# Patient Record
Sex: Male | Born: 1960
Health system: Southern US, Community
[De-identification: ages and names within clinical notes are randomized; demographics above are authoritative.]

## PROBLEM LIST (undated history)

## (undated) DIAGNOSIS — F1010 Alcohol abuse, uncomplicated: Secondary | ICD-10-CM

## (undated) DIAGNOSIS — K449 Diaphragmatic hernia without obstruction or gangrene: Secondary | ICD-10-CM

## (undated) DIAGNOSIS — Z789 Other specified health status: Secondary | ICD-10-CM

## (undated) DIAGNOSIS — E039 Hypothyroidism, unspecified: Secondary | ICD-10-CM

## (undated) DIAGNOSIS — K219 Gastro-esophageal reflux disease without esophagitis: Secondary | ICD-10-CM

## (undated) DIAGNOSIS — E785 Hyperlipidemia, unspecified: Secondary | ICD-10-CM

## (undated) DIAGNOSIS — N529 Male erectile dysfunction, unspecified: Secondary | ICD-10-CM

## (undated) DIAGNOSIS — I10 Essential (primary) hypertension: Secondary | ICD-10-CM

## (undated) DIAGNOSIS — I2510 Atherosclerotic heart disease of native coronary artery without angina pectoris: Secondary | ICD-10-CM

## (undated) DIAGNOSIS — F1910 Other psychoactive substance abuse, uncomplicated: Secondary | ICD-10-CM

## (undated) HISTORY — DX: Essential (primary) hypertension: I10

## (undated) HISTORY — DX: Alcohol abuse, uncomplicated: F10.10

## (undated) HISTORY — PX: TONSILLECTOMY: SUR1361

## (undated) HISTORY — DX: Hyperlipidemia, unspecified: E78.5

## (undated) HISTORY — PX: OTHER SURGICAL HISTORY: SHX169

## (undated) HISTORY — PX: WISDOM TOOTH EXTRACTION: SHX21

## (undated) HISTORY — DX: Male erectile dysfunction, unspecified: N52.9

## (undated) HISTORY — DX: Other specified health status: Z78.9

## (undated) HISTORY — DX: Diaphragmatic hernia without obstruction or gangrene: K44.9

## (undated) HISTORY — DX: Atherosclerotic heart disease of native coronary artery without angina pectoris: I25.10

## (undated) HISTORY — DX: Gastro-esophageal reflux disease without esophagitis: K21.9

## (undated) HISTORY — DX: Other psychoactive substance abuse, uncomplicated: F19.10

## (undated) HISTORY — PX: JOINT REPLACEMENT: SHX530

## (undated) HISTORY — PX: VASECTOMY: SHX75

## (undated) HISTORY — DX: Hypothyroidism, unspecified: E03.9

---

## 1998-02-08 ENCOUNTER — Emergency Department (HOSPITAL_COMMUNITY): Admission: EM | Admit: 1998-02-08 | Discharge: 1998-02-08 | Payer: Self-pay | Admitting: Emergency Medicine

## 1998-05-24 ENCOUNTER — Emergency Department (HOSPITAL_COMMUNITY): Admission: EM | Admit: 1998-05-24 | Discharge: 1998-05-24 | Payer: Self-pay | Admitting: Emergency Medicine

## 1999-12-26 ENCOUNTER — Encounter: Payer: Self-pay | Admitting: *Deleted

## 1999-12-26 ENCOUNTER — Encounter: Admission: RE | Admit: 1999-12-26 | Discharge: 1999-12-26 | Payer: Self-pay | Admitting: *Deleted

## 2001-03-11 ENCOUNTER — Encounter: Admission: RE | Admit: 2001-03-11 | Discharge: 2001-03-11 | Payer: Self-pay | Admitting: *Deleted

## 2001-03-11 ENCOUNTER — Encounter: Payer: Self-pay | Admitting: *Deleted

## 2002-02-28 ENCOUNTER — Emergency Department (HOSPITAL_COMMUNITY): Admission: EM | Admit: 2002-02-28 | Discharge: 2002-02-28 | Payer: Self-pay | Admitting: Emergency Medicine

## 2004-03-08 ENCOUNTER — Emergency Department (HOSPITAL_COMMUNITY): Admission: EM | Admit: 2004-03-08 | Discharge: 2004-03-08 | Payer: Self-pay | Admitting: Family Medicine

## 2004-04-20 ENCOUNTER — Encounter: Admission: RE | Admit: 2004-04-20 | Discharge: 2004-04-20 | Payer: Self-pay | Admitting: Family Medicine

## 2007-03-05 ENCOUNTER — Ambulatory Visit: Payer: Self-pay | Admitting: Family Medicine

## 2007-06-27 ENCOUNTER — Emergency Department: Payer: Self-pay | Admitting: Emergency Medicine

## 2008-08-28 HISTORY — PX: CARDIAC CATHETERIZATION: SHX172

## 2008-11-02 ENCOUNTER — Emergency Department: Payer: Self-pay | Admitting: Internal Medicine

## 2009-03-05 ENCOUNTER — Ambulatory Visit: Payer: Self-pay | Admitting: Cardiovascular Disease

## 2009-03-05 ENCOUNTER — Inpatient Hospital Stay (HOSPITAL_COMMUNITY): Admission: EM | Admit: 2009-03-05 | Discharge: 2009-03-08 | Payer: Self-pay | Admitting: Emergency Medicine

## 2009-04-17 DIAGNOSIS — I10 Essential (primary) hypertension: Secondary | ICD-10-CM | POA: Insufficient documentation

## 2009-04-17 DIAGNOSIS — E785 Hyperlipidemia, unspecified: Secondary | ICD-10-CM | POA: Insufficient documentation

## 2009-04-17 DIAGNOSIS — I2510 Atherosclerotic heart disease of native coronary artery without angina pectoris: Secondary | ICD-10-CM | POA: Insufficient documentation

## 2009-04-22 ENCOUNTER — Ambulatory Visit: Payer: Self-pay | Admitting: Cardiology

## 2009-05-10 ENCOUNTER — Telehealth: Payer: Self-pay | Admitting: Cardiology

## 2009-05-18 ENCOUNTER — Telehealth: Payer: Self-pay | Admitting: Cardiology

## 2009-06-25 ENCOUNTER — Telehealth (INDEPENDENT_AMBULATORY_CARE_PROVIDER_SITE_OTHER): Payer: Self-pay | Admitting: *Deleted

## 2009-06-29 ENCOUNTER — Encounter: Payer: Self-pay | Admitting: Cardiovascular Disease

## 2009-08-09 ENCOUNTER — Ambulatory Visit: Payer: Self-pay | Admitting: Family Medicine

## 2010-08-30 ENCOUNTER — Telehealth: Payer: Self-pay | Admitting: Cardiology

## 2010-09-19 ENCOUNTER — Encounter: Payer: Self-pay | Admitting: Cardiology

## 2010-09-19 ENCOUNTER — Ambulatory Visit
Admission: RE | Admit: 2010-09-19 | Discharge: 2010-09-19 | Payer: Self-pay | Source: Home / Self Care | Attending: Cardiology | Admitting: Cardiology

## 2010-09-22 ENCOUNTER — Ambulatory Visit: Admit: 2010-09-22 | Payer: Self-pay | Admitting: Family Medicine

## 2010-09-22 DIAGNOSIS — Z0289 Encounter for other administrative examinations: Secondary | ICD-10-CM

## 2010-09-27 NOTE — Letter (Signed)
Summary: Insurance forms  Insurance forms   Imported By: Kassie Mends 07/21/2009 11:26:53  _____________________________________________________________________  External Attachment:    Type:   Image     Comment:   External Document

## 2010-09-27 NOTE — Progress Notes (Signed)
  Walk in Patient Form Recieved " PLease complete form for [facility] Stay will forward to Gaylyn Cheers Mesiemore  June 25, 2009 11:55 AM    Appended Document:  paperwork complete and placed at the front desk for pick up. Synetta Fail called.

## 2010-09-27 NOTE — Assessment & Plan Note (Signed)
Summary: NP6/AMD   Visit Type:  new patient Primary Provider:  NONE  CC:  no cardiac complaints.  History of Present Illness: 50 yo with history of nonobstructive CAD, HTN, hyperlipidemia presents for followup.  Patient was initially seen at [facility] in 7/10 with atypical chest pain.  He had an indeterminant coronary CTA with coronary calcium score 424.  LHC showed mild disease except for 90% ostial stenosis in septal perforator.  Patient was managed medically.  Since going home, he has had no further chest pain.  He has no exercise limitations (no exertional shortness of breath).  His BP is 152/90 but he has actually been out of lisinopril for 2 days.  Since starting the BP med, he has had erectile dysfunction and has felt depressed.  He does not smoke but is a heavy drinker.   Labs (7/10): LDL 220  ECG:  NSR, normal  Current Medications (verified): 1)  Aspirin 81 Mg Tbec (Aspirin) .Marland Kitchen.. 1 Tab By Mouth Daily 2)  Synthroid 50 Mcg Tabs (Levothyroxine Sodium) .Marland Kitchen.. 1 Tab By Mouth Daily 3)  Lisinopril 10 Mg Tabs (Lisinopril) .Marland Kitchen.. 1 Tab By Mouth Daily 4)  Lipitor 20 Mg Tabs (Atorvastatin Calcium) .Marland Kitchen.. 1 Tab By Mouth Daily 5)  Prilosec 40 Mg Cpdr (Omeprazole) .Marland Kitchen.. 1 Tab By Mouth Daily  Past History:  Past Medical History: 1. Hiatal hernia and gastroesophageal reflux disease.  2. Hypertension.  3. Hypothyroidism.  4. ETOH abuse.  5. Hyperlipidemia 6. Hypothyroidism 7. CAD: Coronary CTA (7/10) showed calcium score 424 and was indeterminant for coronary disease.  LHC was done (7/10) showing EF 55%, 30% proximal and mid LAD stenoses, 90% stenosis ostially in a moderate septal perforator, otherwise luminal irregularities.  Patient was managed medically.   Family History: Mother died at 41, he is not sure of what.  Father died at 68 of CAD and PVD.  He has a sister, who has substance and tobacco abuse, but otherwise alive and well.      Social History: He lives in [place] with his  fiance.  He is a Naval architect (moves mobile homes).  He denies tobacco abuse but drinks greater than a 12-pack of beer a day.  He denies drug use and does not exercise.   Review of Systems       All systems reviewed and negative except as per HPI.   Vital Signs:  Patient profile:   50 year old male Height:      70 inches Weight:      237 pounds BMI:     34.13 Pulse rate:   80 / minute Pulse rhythm:   regular BP sitting:   152 / 90  (right arm) Cuff size:   large  Vitals Entered By: Mercer Pod (April 22, 2009 10:33 AM)  Physical Exam  General:  Well developed, well nourished, in no acute distress. Head:  normocephalic and atraumatic Nose:  no deformity, discharge, inflammation, or lesions Mouth:  Teeth, gums and palate normal. Oral mucosa normal. Neck:  Neck supple, no JVD. No masses, thyromegaly or abnormal cervical nodes. Lungs:  Clear bilaterally to auscultation and percussion. Heart:  Non-displaced PMI, chest non-tender; regular rate and rhythm, S1, S2 without murmurs, rubs. +S4. Carotid upstroke normal, no bruit.  Pedals normal pulses. No edema, no varicosities. Abdomen:  Bowel sounds positive; abdomen soft and non-tender without masses, organomegaly, or hernias noted. No hepatosplenomegaly. Msk:  Back normal, normal gait. Muscle strength and tone normal. Extremities:  No clubbing or cyanosis.  Neurologic:  Alert and oriented x 3. Skin:  Intact without lesions or rashes. Psych:  Normal affect.   Impression & Recommendations:  Problem # 1:  CAD, UNSPECIFIED SITE (ICD-414.00) Nonobstructive CAD on cath with high calcium score.  Patient needs aggressive risk factor modification to prevent progression.  His LDL is quite high and his BP is high.  Continue ASA.  Continue BP and lipid control.  No further ischemic symptoms.   Problem # 2:  HYPERLIPIDEMIA-MIXED (ICD-272.4) Patient's LDL was 220 in 7/10.  He is on Lipitor 20 mg daily.  Goal LDL given his CAD would be <  70.  I expect that he will need a higher dose.  Check lipids/LFTs in about a month.   Problem # 3:  HYPERTENSION, BENIGN (ICD-401.1) BP is high.  Patient is off his lisinopril.  He is distressed by erectile dysfunction which he has been having since starting the BP med.  I will change him over to Norvasc 5 mg daily to see if he can tolerate this one better.  BP check in 2 wks, titrate up if needed.    Followup in 3 months to address lipids and BP.   Patient Instructions: 1)  Your physician recommends that you schedule a follow-up appointment in: 3 months 2)  Your physician recommends that you return for a FASTING lipid profile: September 3)  Your physician has recommended you make the following change in your medication: stop lisinopril, start Norvasc 5 mg daily 4)  Your physician has requested that you regularly monitor and record your blood pressure readings at home.  Please use the same machine at the same time of day to check your readings and record them. I will call you in 2 weeks to check on readings. Prescriptions: LIPITOR 20 MG TABS (ATORVASTATIN CALCIUM) 1 tab by mouth daily  #30 x 6   Entered by:   Charlena Cross, RN, BSN   Authorized by:   Marca Ancona, MD   Signed by:   Charlena Cross, RN, BSN on 04/22/2009   Method used:   Electronically to        [id number] (retail)       [address]       [place], [place]  [address]       Ph: [phone] or [phone]       Fax: [phone]   RxID:   [id number] SYNTHROID 50 MCG TABS (LEVOTHYROXINE SODIUM) 1 tab by mouth daily  #30 x 1   Entered by:   Charlena Cross, RN, BSN   Authorized by:   Marca Ancona, MD   Signed by:   Charlena Cross, RN, BSN on 04/22/2009   Method used:   Electronically to        [id number] (retail)       [address]       [place], [place]  [address]       Ph: [phone] or [phone]       Fax: [phone]   RxID:    [id number] AMLODIPINE BESYLATE 5 MG TABS (AMLODIPINE BESYLATE) Take one tablet by mouth daily  #30 x 6   Entered by:   Charlena Cross, RN, BSN   Authorized by:   Marca Ancona, MD   Signed by:   Charlena Cross, RN, BSN on 04/22/2009   Method  used:   [facility]ronically to        [id number] ([facility]       [address]       [place], [place]  [address]       Ph: [phone] or [phone]       Fax: [phone]   RxID:   [id number]

## 2010-09-27 NOTE — Progress Notes (Signed)
  Phone Note Outgoing Call   Summary of Call: called pt to check on BP.  Pt states that BP's have normalized on Norvasc.  However, pt has developed a severe sweating problem that was a rancid odor.  This has only started since starting the new medication.  Would like to see if Dr. Shirlee Latch could prescribe something different. Initial call taken by: Charlena Cross, RN, BSN,  May 10, 2009 10:32 AM     Appended Document:  This medication is one of the least likely to cause erectile dysfunction.  Would try it a week longer and if he still is having a problem can change it.   Appended Document:  spoke with pt's wife regarding medication side effects.  wife will relay information to pt.

## 2010-09-27 NOTE — Progress Notes (Signed)
Summary: LIPITOR  Phone Note Call from Patient Call back at Home Phone [phone]   Caller: SELF Call For: [facility] Summary of Call: CANNOT TAKE LIPITOR ANYMORE-MAKING ALL OF HIS MUSCLES IN HIS LEGS AND ARMS HURT-CANNOT BEND THEM Initial call taken by: Harlon Flor,  May 18, 2009 10:28 AM  Follow-up for Phone Call        message sent to DM. Follow-up by: Charlena Cross, RN, BSN,  May 18, 2009 12:03 PM     Appended Document: LIPITOR Tell him to stop Lipitor now.  His LDL is very high.  Would he be willing to start a lower dose of a statin that is less likely to cause muscle pain after a few weeks off statins altogether? If he is willing to try, would go off statin for 4 weeks then try pravastatin at 40 mg daily with Co Q-10 100 mg daily if muscle pain has resolved.   Appended Document: LIPITOR pt agrees to try medication.  will start @ the end of oct. Charlena Cross RN BSN

## 2010-09-27 NOTE — Progress Notes (Signed)
Summary: PHI  PHI   Imported By: Harlon Flor 04/22/2009 11:47:31  _____________________________________________________________________  External Attachment:    Type:   Image     Comment:   External Document

## 2010-09-29 NOTE — Progress Notes (Signed)
Summary: Needs f/u  Phone Note Call from Patient Call back at Home Phone [phone]   Caller: Patient Call For: nurse Summary of Call: Would like to discuss medications with you.  Initial call taken by: Lysbeth Galas CMA,  August 30, 2010 12:16 PM  Follow-up for Phone Call        Spoke to pt, he is unclear of what medications he is supposed to be taking, he states he only takes amlodipine and asa 81. Reviewed pt's chart, pt has not had ov since 03/2009 and did not show for 3 mo f/u after that. Please schedule f/u with Dr. Shirlee Latch. Follow-up by: Lanny Hurst RN,  August 30, 2010 4:22 PM  Additional Follow-up for Phone Call Additional follow up Details #1::        LMOM TCB to schedule an appt with [facility]. Additional Follow-up by: Harlon Flor,  September 07, 2010 1:15 PM    Additional Follow-up for Phone Call Additional follow up Details #2::    Spoke to pt, scheduled him to see Dr. Shirlee Latch 09/19/10 at 3:45. Follow-up by: Lanny Hurst RN,  September 08, 2010 10:50 AM

## 2010-09-29 NOTE — Assessment & Plan Note (Signed)
Summary: ROV   Visit Type:  Follow-up Primary Provider:  NONE  CC:  "doing well" denies chest pain and SOB.Marland Kitchen  History of Present Illness: 50 yo with history of nonobstructive CAD, HTN, hyperlipidemia presents for followup.  Patient was initially seen at [facility] in 7/10 with atypical chest pain.  He had an indeterminant coronary CTA with coronary calcium score 424.  LHC showed mild disease except for 90% ostial stenosis in septal perforator.  Patient was managed medically.  He has had no further chest pain or exertional dyspnea.  I saw him over a year ago and started him on BP meds and a statin.  He had myalgias with Lipitor so is not taking anything now for his lipids.  He continues to take ASA and amlodipine.  BP is good today.  He has had some conflict lately in his personal life and his girlfriend is planning to move out.   Patient's main complaint today is left shoulder pain.  He fell on his left shoulder recently and now has shoulder pain and is unable to abduct the arm above horizontal.    Labs (7/10): LDL 220  ECG:  NSR, biatrial enlargement  Current Medications (verified): 1)  Aspirin 81 Mg Tbec (Aspirin) .Marland Kitchen.. 1 Tab By Mouth Daily 2)  Synthroid 50 Mcg Tabs (Levothyroxine Sodium) .Marland Kitchen.. 1 Tab By Mouth Daily 3)  Prilosec 40 Mg Cpdr (Omeprazole) .Marland Kitchen.. 1 Tab By Mouth Daily 4)  Amlodipine Besylate 5 Mg Tabs (Amlodipine Besylate) .... Take One Tablet By Mouth Daily 5)  Pravastatin Sodium 40 Mg Tabs (Pravastatin Sodium) .... Take One Tablet By Mouth Daily At Bedtime 6)  Co Q-10 100 Mg Caps (Coenzyme Q10) .Marland Kitchen.. 1 Tab Daily  Allergies (verified): No Known Drug Allergies  Past History:  Past Surgical History: Last updated: 04/17/2009 Tonsillectomy  Family History: Last updated: 04/27/2009 Mother died at 59, he is not sure of what.  Father died at 48 of CAD and PVD.  He has a sister, who has substance and tobacco abuse, but otherwise alive and well.      Social History: Last  updated: 09/19/2010 He lives in [place] with his girlfriend, but she is getting ready to move out.  He is a Naval architect (moves mobile homes).  He denies tobacco abuse but drinks greater than a 12-pack of beer a day.  He denies drug use and does not exercise.   Past Medical History: 1. Hiatal hernia and gastroesophageal reflux disease.  2. Hypertension: erectile dysfunction with ACEI.  3. Hypothyroidism.  4. ETOH abuse.  5. Hyperlipidemia: myalgias with atorvastatin.  6. Hypothyroidism 7. CAD: Coronary CTA (7/10) showed calcium score 424 and was indeterminant for coronary disease.  LHC was done (7/10) showing EF 55%, 30% proximal and mid LAD stenoses, 90% stenosis ostially in a moderate septal perforator, otherwise luminal irregularities.  Patient was managed medically.   Family History: Reviewed history from 27-Apr-2009 and no changes required. Mother died at 37, he is not sure of what.  Father died at 57 of CAD and PVD.  He has a sister, who has substance and tobacco abuse, but otherwise alive and well.      Social History: Reviewed history from 2009/04/27 and no changes required. He lives in [place] with his girlfriend, but she is getting ready to move out.  He is a Naval architect (moves mobile homes).  He denies tobacco abuse but drinks greater than a 12-pack of beer a day.  He denies drug use and does  not exercise.   Review of Systems       All systems reviewed and negative except as per HPI.   Vital Signs:  Patient profile:   50 year old male Height:      70 inches Weight:      241.75 pounds BMI:     34.81 Pulse rate:   91 / minute BP sitting:   130 / 82  (left arm) Cuff size:   large  Vitals Entered By: Lysbeth Galas CMA (September 19, 2010 4:31 PM)  Physical Exam  General:  Well developed, well nourished, in no acute distress. Neck:  Neck supple, no JVD. No masses, thyromegaly or abnormal cervical nodes. Lungs:  Clear bilaterally to auscultation and  percussion. Heart:  Non-displaced PMI, chest non-tender; regular rate and rhythm, S1, S2 without murmurs, rubs. +S4. Carotid upstroke normal, no bruit.  Pedals normal pulses. No edema, no varicosities. Abdomen:  Bowel sounds positive; abdomen soft and non-tender without masses, organomegaly, or hernias noted. No hepatosplenomegaly. Msk:  Unable to abduct left arm above horizontal level.  Extremities:  No clubbing or cyanosis. Neurologic:  Alert and oriented x 3. Psych:  Normal affect.   Impression & Recommendations:  Problem # 1:  HYPERLIPIDEMIA-MIXED (ICD-272.4) Very high LDL when last checked in 2010.  No statin currently given myalgias with Lipitor.  I am going to start pravastatin 40 mg daily with 100 mg Coenzyme Q10.  Hopefully he can tolerate this.  Given known CAD, goal LDL is < 70 ideally.   Problem # 2:  CAD, UNSPECIFIED SITE (ICD-414.00) Nonobstructive CAD on cath with high calcium score.  Patient needs aggressive risk factor modification to prevent progression.  Needs continued control of BP and will start statin.  He takes ASA 81 mg daily.   Problem # 3:  HYPERTENSION, BENIGN (ICD-401.1) Well-controlled today.  Continue amlodipine.   Problem # 4:  HYPOTHYROIDISM-IATROGENIC (ICD-244.3) Patient carries diagnosis of hypothyroidism.  Though it is on his list, he has not been taking Synthroid.  Will check TSH.    Problem # 5:  SHOULDER PAIN Left shoulder pain and difficulty with abduction beyond horizontal since he fell on his shoulder.  He is worried about developing a frozen shoulder.  I am going to refer him to Dr. Patsy Lager for shoulder evaluation and to serve as his PCP.   Other Orders: EKG w/ Interpretation (93000)  Patient Instructions: 1)  Your physician recommends that you schedule a follow-up appointment in: 1 year 2)  TO BE SCHEDULED IN 2 MONTHS:  Your physician recommends that you return for a FASTING lipid profile: (liver, lipid, BMP) 3)  Your physician recommends  that you continue on your current medications as directed. Please refer to the Current Medication list given to you today. CONTINUE Pravastatin 40mg  and Coenzyme Q10 100mg . 4)  You have an appt with Dr. Dallas Schimke at [place] [facility] this Thursday 09/22/10 @ 9:00am for your shoulder, please arrive at 8:45am. Prescriptions: PRAVASTATIN SODIUM 40 MG TABS (PRAVASTATIN SODIUM) Take one tablet by mouth daily at bedtime  #30 x 6   Entered by:   Lanny Hurst RN   Authorized by:   Marca Ancona, MD   Signed by:   Lanny Hurst RN on 09/19/2010   Method used:   [facility] to        [id number]       [address] [facility]  [place], [place]  [address]       Ph: [phone] or [phone]       Fax: [phone]   RxID:   [id number]

## 2010-10-07 ENCOUNTER — Telehealth: Payer: Self-pay | Admitting: Cardiology

## 2010-10-19 NOTE — Progress Notes (Signed)
Summary: Pain on right side  Phone Note Call from Patient Call back at Home Phone [phone]   Caller: Self Call For: [place] Summary of Call: Pt fell several weeks ago.  There was pain on that side and now the pain has moved to the right side.  Has been told that it might be heart related. Initial call taken by: [place],  October 07, 2010 3:03 PM  Follow-up for Phone Call        Spoke to pt, he fell on the job a few weeks ago (prior to his last ov) and injured his left side (arm/shoulder/side). He did not see an MD after the fall. He had been taking ibuprofen/aleve for pain which did not help his pain at first. Now he states his pain has resolved on left side, now he c/o pain in his right shoulder down to elbow "throbbing sensation."  Pt denies chest pain/SOB, any other symptoms. Pt was told this could be cardiac. Advised pt to monitor over the weekend, and to continue to take aleve/ibuprofen/tylenol and find out if any of those releive the pain. If worsens and not getting any better, told him he may need to see PCP if due to musculoskeletal injury. Advised pt if SOB/Chest pain or any additional symptoms begin over the weekend to go to ER, however, this does not sound cardiac related in nature and may be musculoskeletal. Will notify Dr. Shirlee Latch to see if he has any further recommendations, and notified pt to call me on Monday to update me on his status. Follow-up by: Lanny Hurst RN,  October 07, 2010 4:46 PM     Appended Document: Pain on right side Does not sound particularly worrisome for cardiac problem, would followup with him Monday though.   Appended Document: Pain on right side Attempted to call pt LMOM TCB /MES  Appended Document: Pain on right side Spoke to pt, he states now the pain has gone back to the left arm and he states pain is only in arm, no other symptoms. Advised pt to see his PCP about this and that this does not sound cardiac related. Pt is taking Aleve two  times a day with no relief. Pt states he will see PCP.

## 2010-11-18 ENCOUNTER — Other Ambulatory Visit: Payer: Self-pay | Admitting: *Deleted

## 2010-12-04 LAB — CBC
HCT: 44.5 % (ref 39.0–52.0)
HCT: 46 % (ref 39.0–52.0)
Hemoglobin: 15.8 g/dL (ref 13.0–17.0)
Hemoglobin: 16.4 g/dL (ref 13.0–17.0)
MCHC: 35.5 g/dL (ref 30.0–36.0)
MCHC: 35.5 g/dL (ref 30.0–36.0)
MCV: 99.3 fL (ref 78.0–100.0)
MCV: 99.5 fL (ref 78.0–100.0)
Platelets: 193 10*3/uL (ref 150–400)
Platelets: 227 10*3/uL (ref 150–400)
RBC: 4.47 MIL/uL (ref 4.22–5.81)
RBC: 4.64 MIL/uL (ref 4.22–5.81)
RDW: 12.6 % (ref 11.5–15.5)
RDW: 12.6 % (ref 11.5–15.5)
WBC: 3.8 10*3/uL — ABNORMAL LOW (ref 4.0–10.5)
WBC: 4.2 10*3/uL (ref 4.0–10.5)

## 2010-12-04 LAB — BASIC METABOLIC PANEL
BUN: 7 mg/dL (ref 6–23)
BUN: 8 mg/dL (ref 6–23)
CO2: 26 mEq/L (ref 19–32)
CO2: 26 mEq/L (ref 19–32)
Calcium: 8.7 mg/dL (ref 8.4–10.5)
Calcium: 9.1 mg/dL (ref 8.4–10.5)
Chloride: 100 mEq/L (ref 96–112)
Chloride: 102 mEq/L (ref 96–112)
Creatinine, Ser: 0.81 mg/dL (ref 0.4–1.5)
Creatinine, Ser: 0.94 mg/dL (ref 0.4–1.5)
GFR calc Af Amer: >60 mL/min (ref >60)
GFR calc Af Amer: >60 mL/min (ref >60)
GFR calc non Af Amer: >60 mL/min (ref >60)
GFR calc non Af Amer: >60 mL/min (ref >60)
Glucose, Bld: 110 mg/dL — ABNORMAL HIGH (ref 70–99)
Glucose, Bld: 124 mg/dL — ABNORMAL HIGH (ref 70–99)
Potassium: 4.1 mEq/L (ref 3.5–5.1)
Potassium: 4.5 mEq/L (ref 3.5–5.1)
Sodium: 132 mEq/L — ABNORMAL LOW (ref 135–145)
Sodium: 136 mEq/L (ref 135–145)

## 2010-12-04 LAB — GLUCOSE, CAPILLARY: Glucose-Capillary: 112 mg/dL — ABNORMAL HIGH (ref 70–99)

## 2010-12-04 LAB — LIPID PANEL
Cholesterol: 294 mg/dL — ABNORMAL HIGH (ref 0–200)
Cholesterol: 311 mg/dL — ABNORMAL HIGH (ref 0–200)
HDL: 53 mg/dL (ref >39)
HDL: 54 mg/dL (ref >39)
LDL Cholesterol: 218 mg/dL — ABNORMAL HIGH (ref 0–99)
LDL Cholesterol: 220 mg/dL — ABNORMAL HIGH (ref 0–99)
Total CHOL/HDL Ratio: 5.5 RATIO
Total CHOL/HDL Ratio: 5.8 RATIO
Triglycerides: 104 mg/dL (ref <150)
Triglycerides: 195 mg/dL — ABNORMAL HIGH (ref <150)
VLDL: 21 mg/dL (ref 0–40)
VLDL: 39 mg/dL (ref 0–40)

## 2010-12-04 LAB — HEPATIC FUNCTION PANEL
ALT: 47 U/L (ref 0–53)
AST: 46 U/L — ABNORMAL HIGH (ref 0–37)
Albumin: 3.8 g/dL (ref 3.5–5.2)
Alkaline Phosphatase: 48 U/L (ref 39–117)
Bilirubin, Direct: 0.3 mg/dL (ref 0.0–0.3)
Indirect Bilirubin: 1.2 mg/dL — ABNORMAL HIGH (ref 0.3–0.9)
Total Bilirubin: 1.5 mg/dL — ABNORMAL HIGH (ref 0.3–1.2)
Total Protein: 6.9 g/dL (ref 6.0–8.3)

## 2010-12-04 LAB — PROTIME-INR
INR: 0.9 (ref 0.00–1.49)
Prothrombin Time: 12.4 seconds (ref 11.6–15.2)

## 2010-12-04 LAB — DIFFERENTIAL
Basophils Absolute: 0 10*3/uL (ref 0.0–0.1)
Basophils Relative: 1 % (ref 0–1)
Eosinophils Absolute: 0.1 10*3/uL (ref 0.0–0.7)
Eosinophils Relative: 1 % (ref 0–5)
Lymphocytes Relative: 20 % (ref 12–46)
Lymphs Abs: 0.7 10*3/uL (ref 0.7–4.0)
Monocytes Absolute: 0.4 10*3/uL (ref 0.1–1.0)
Monocytes Relative: 10 % (ref 3–12)
Neutro Abs: 2.6 10*3/uL (ref 1.7–7.7)
Neutrophils Relative %: 68 % (ref 43–77)

## 2010-12-04 LAB — POCT CARDIAC MARKERS
CKMB, poc: 2 ng/mL (ref 1.0–8.0)
CKMB, poc: 2.7 ng/mL (ref 1.0–8.0)
CKMB, poc: 3.3 ng/mL (ref 1.0–8.0)
CKMB, poc: 4.2 ng/mL (ref 1.0–8.0)
Myoglobin, poc: 109 ng/mL (ref 12–200)
Myoglobin, poc: 119 ng/mL (ref 12–200)
Myoglobin, poc: 145 ng/mL (ref 12–200)
Myoglobin, poc: 84.2 ng/mL (ref 12–200)
Troponin i, poc: <0.05 ng/mL (ref 0.00–0.09)
Troponin i, poc: <0.05 ng/mL (ref 0.00–0.09)
Troponin i, poc: <0.05 ng/mL (ref 0.00–0.09)
Troponin i, poc: <0.05 ng/mL (ref 0.00–0.09)

## 2010-12-04 LAB — CARDIAC PANEL(CRET KIN+CKTOT+MB+TROPI)
CK, MB: 5.4 ng/mL — ABNORMAL HIGH (ref 0.3–4.0)
CK, MB: 5.7 ng/mL — ABNORMAL HIGH (ref 0.3–4.0)
Relative Index: 2 (ref 0.0–2.5)
Relative Index: 2.4 (ref 0.0–2.5)
Total CK: 229 U/L (ref 7–232)
Total CK: 289 U/L — ABNORMAL HIGH (ref 7–232)
Troponin I: 0.02 ng/mL (ref 0.00–0.06)
Troponin I: 0.02 ng/mL (ref 0.00–0.06)

## 2010-12-04 LAB — APTT: aPTT: 27 seconds (ref 24–37)

## 2010-12-04 LAB — CK TOTAL AND CKMB (NOT AT ARMC)
CK, MB: 6.2 ng/mL — ABNORMAL HIGH (ref 0.3–4.0)
Relative Index: 2.8 — ABNORMAL HIGH (ref 0.0–2.5)
Total CK: 221 U/L (ref 7–232)

## 2010-12-04 LAB — HEMOGLOBIN A1C
Hgb A1c MFr Bld: 5.2 % (ref 4.6–6.1)
Mean Plasma Glucose: 103 mg/dL

## 2010-12-04 LAB — TROPONIN I: Troponin I: 0.03 ng/mL (ref 0.00–0.06)

## 2010-12-04 LAB — TSH: TSH: 7.545 u[IU]/mL — ABNORMAL HIGH (ref 0.350–4.500)

## 2011-01-10 NOTE — Cardiovascular Report (Signed)
NAMEJOCSAN, Samuel Heath NO.:  [id number]   MEDICAL RECORD NO.:  [id number]          PATIENT TYPE:  INP   LOCATION:  2040                         FACILITY:  [facility]   PHYSICIAN:  Marca Ancona, MD      DATE OF BIRTH:  Mar 25, 1961   DATE OF PROCEDURE:  03/08/2009  DATE OF DISCHARGE:  03/08/2009                            CARDIAC CATHETERIZATION   PROCEDURES:  1. Left heart catheterization.  2. Coronary angiography.  3. Left ventriculography.  4. Angio-Seal placement.   INDICATION:  Chest pain in a patient with an indeterminate coronary CT  angiogram.   PROCEDURE NOTE:  After informed consent was obtained, the right groin  was sterilely prepped and draped.  A 1% lidocaine was used to locally  anesthetize the right groin area.  A 6-French arterial sheath was placed  using Seldinger technique into the right common femoral artery.  The  left coronary artery was engaged using a 6-French multipurpose catheter.  The left ventricle was entered using the 6-French multipurpose catheter  and the right coronary artery was entered using the 6-French JR4  catheter.  There were no complications.  An injection was also done to  show the sheath was entered into the left common femoral artery.  An  Angio-Seal was then deployed.   FINDINGS:  1. Hemodynamics:  Aorta 146/95, LV 144/10.  2. Left ventriculography:  EF was estimated at 55%.  There were no      wall motion abnormalities.  3. Right coronary artery:  The right coronary artery was a moderate-      sized vessel.  It was probably codominant.  There was some catheter      spasm proximally, but no significant disease.  The ostium was shown      to be free of significant disease on injections taken approaching      the vessel.  4. Left main:  There is no significant disease in the left main      coronary artery.  5. Left circumflex:  The left circumflex system was large and co-      dominant.  There was a ramus and 4  moderate-sized obtuse marginal      vessels.  6. LAD:  The first and second diagonals were moderate-sized vessels      with no significant disease.  Following these 2 diagonals, there      was a small-to-moderate sized septal perforator with 90% ostial      stenosis.  The mid LAD had about 30% stenosis and the distal LAD      had about 30% stenosis.   ASSESSMENT AND PLAN:  This is a 50 year old who presented with chest  pain and had an indeterminate coronary CT angiogram.  The concerning  area on the coronary computed tomography angiography was in the mid LAD.  The left heart catheterization shows only mild disease in this area.  There is a significantly diseased ostial  septal perforator, however.  We will plan on medical management of the  patient's coronary disease with aggressive treatment of his risk  factors.  He  will be discharged home on Synthroid 50 mcg a day for his  hypothyroidism, lisinopril 10 mg a day for his hypertension, aspirin 81  mg a day and Lipitor 20 mg a day.      Marca Ancona, MD  Electronically Signed     DM/MEDQ  D:  03/08/2009  T:  03/08/2009  Job:  [id number]

## 2011-01-10 NOTE — Discharge Summary (Signed)
NAMEROWLAND, ERICSSON NO.:  [id number]   MEDICAL RECORD NO.:  [id number]          PATIENT TYPE:  INP   LOCATION:  2040                         FACILITY:  [facility]   PHYSICIAN:  Marca Ancona, MD      DATE OF BIRTH:  06/08/1961   DATE OF ADMISSION:  03/05/2009  DATE OF DISCHARGE:  03/08/2009                               DISCHARGE SUMMARY   PROCEDURES:  1. Cardiac catheterization.  2. Coronary arteriogram.  3. Left ventriculogram.   PRIMARY FINAL DISCHARGE DIAGNOSIS:  Chest pain, nonobstructive coronary  artery disease and catheterization.   SECONDARY DIAGNOSES:  1. History of hiatal hernia and gastroesophageal reflux disease.  2. Hypertension.  3. Hypothyroidism.  4. History of ethyl alcohol (consumption, dependency) abuse.  5. Family history of coronary artery disease, not premature.   TIME OF DISCHARGE:  36 minutes.   HOSPITAL COURSE:  Samuel Heath is a 50 year old male with no previous  history of coronary artery disease.  He had chest pain and had been to  [facility] hospital but his symptoms did not improve.  He came to [facility] and was admitted for further evaluation and treatment.   He had CT angiography of the chest which showed greater than 50%  calcification in the proximal LAD.  His calcium score was 424.  His  cardiac enzymes were negative for MI.  Cardiac catheterization was  performed on March 08, 2009 which showed 30% LAD and a 90% septal  perforator.  He had some catheter spasm with the RCA.  His EF was 55%  with no wall motion abnormalities.   Dr. Shirlee Latch evaluated the films and felt that he had probable noncardiac  chest pain.  His blood pressure was treated by adding lisinopril to his  medication regimen.  He also had an elevated TSH of 7.5 and was started  on low-dose Synthroid.  His LDL was elevated at 220 and he was started  on Lipitor.  Bedrest is pending completion but if his groin is stable  with ambulation he is tentatively  considered stable for discharge on  March 08, 2009 with outpatient followup arranged.   DISCHARGE INSTRUCTIONS:  1. His activity level is to be increased gradually with no driving for      2 days and no lifting for a week.  2. He is supposed to call our office for problems with the cath site.  3. He is encouraged to stick to a low-sodium heart-healthy diet.  4. He is to follow up with Dr. Shirlee Latch in [place] on March 17, 2009      at 11 a.m.  5. He is encouraged to obtain a primary care physician.   DISCHARGE MEDICATIONS:  1. Aspirin 81 mg daily.  2. Synthroid 50 mcg daily.  3. Lisinopril 10 mg a day.  4. Lipitor 20 mg a day p.m.  5. Prilosec OTC daily.   Samuel Heath was advised to limit alcohol and is encouraged to drink only  112 ounces beer daily.      Samuel Demark, PA-C      Marca Ancona,  MD  Electronically Signed    RB/MEDQ  D:  03/08/2009  T:  03/08/2009  Job:  [id number]

## 2011-01-10 NOTE — H&P (Signed)
NAMELIOR, HOEN NO.:  [id number]   MEDICAL RECORD NO.:  [id number]          PATIENT TYPE:  INP   LOCATION:  2040                         FACILITY:  [facility]   PHYSICIAN:  Noralyn Pick. Eden Emms, MD, FACCDATE OF BIRTH:  07-09-1961   DATE OF ADMISSION:  03/05/2009  DATE OF DISCHARGE:                              HISTORY & PHYSICAL   PRIMARY CARDIOLOGIST:  [facility].  He should follow up in  [place].  Seen by Dr Eden Emms   PRIMARY CARE PHYSICIAN:  He has a primary care doctor but does not know  her name and has not seen her in many years.   PATIENT PROFILE:  A 50 year old Caucasian male without a prior history  of CAD who presents to the ED following a 3- to 60-month history of daily  chest pain.   PROBLEMS:  1. Chest pain.      a.     Status post ER evaluation at [facility] in March       2010, reportedly normal.  2. Hypertension.  3. EtOH abuse.  Drinking a 12-pack to a case of beer daily.  4. Status post tonsillectomy as a child.   HISTORY OF PRESENT ILLNESS:  A 60 year old Caucasian male without  history of CAD with a history of daily chest pain dating back to March  2010, at which time he was seen at [facility] ED with negative  lab  evaluation.  They told he was hypertensive and prescribed the  medication.  He was also advised to follow up with the primary care  Aniaya Bacha.  He never did followup and never did take the medication.  Since that ER visit in March, he has been having daily rest and  exertional left-sided chest discomfort associated with shortness of  breath and occasional diaphoresis, lasting about 15 minutes and  resolving spontaneously.  Symptoms are somewhat that when he rubs his  chest.  Last night, he did feel quite right, although he cannot pinpoint  what it was that felt poorly.  When he woke up this morning, he was very  diaphoretic and had recurrent chest throbbing.  He presented to the  [facility] ED by 8:30 this  morning and has had multiple negative point-  of-care markers.  He underwent CT angiography of the chest, which had  shown greater than 50% calcification in the proximal LAD.  He also had a  calcium score of 424.  He is currently pain free.  Dr. Janice Coffin reviewed  the cardiac CT and felt that the LAD disease was moderate and probably  not flow-limiting.  The was significant external remodeling in areas of  calcification but sginificant residual lumen.   ALLERGIES:  No known drug allergies.   HOME MEDICATIONS:  Aspirin and multivitamin.   FAMILY HISTORY:  Mother died at 67, he is not sure of what.  Father died  at 10 of CAD and PVD.  He has a sister, who has substance and tobacco  abuse, but otherwise, alive and well.   SOCIAL HISTORY:  He lives in [place] with his fiance.  He is a Ecologist.  He denies tobacco abuse but drinks greater than a 12-pack of  beer a day.  He denies drug use and does not exercise.   REVIEW OF SYSTEMS:  Positive for occasional diaphoresis, chest pain, and  shortness of breath.  He has daily diarrhea.  Otherwise, all systems is  reviewed and negative.  He is a full code.   PHYSICAL EXAMINATION:  VITAL SIGNS:  Temperature 98.0, heart rate 64,  respirations 18, blood pressure 138/88, pulse ox 98% on 2 liters.  GENERAL:  Pleasant white male in no acute distress, awake, alert, and  oriented x3.  Normal affect.  HEENT:  Normal.  NEUROLOGIC:  Grossly intact and nonfocal.  SKIN:  Warm and dry without lesions or masses.  NECK:  Supple without bruits or JVD.  LUNGS:  Respirations regular and unlabored.  Clear to auscultation.  CARDIAC:  Regular S1 and S2.  No S3, S4, or murmurs.  ABDOMEN:  Round, soft, nontender, nondistended.  Bowel sounds present  x4.  EXTREMITIES:  Warm dry, pink.  No clubbing, cyanosis, or edema.  Dorsalis pedis, posterior tibial pulses 2+ and equal bilaterally.   ACCESSORY CLINICAL FINDINGS:  Chest x-ray shows mild central pulmonary   vessel prominence.  No infiltrates, CHF, or pneumothorax.  Chest CT  shows 50% plus LAD stenosis distal to the second diagonal.  Also some  proximal LAD stenosis.  Calcium score of 424 which places him in the  75th percentile.  EKG shows sinus rhythm at rate 76.  No acute ST-T  changes.   Hemoglobin 16.4, hematocrit 46.0, WBCs 3.8, platelets 227.  Sodium 132,  potassium 4.5, chloride 100, CO2 of 26, BUN 7, creatinine 0.94, glucose  124, total bilirubin 1.5, alkaline phosphatase 48, AST 46, ALT 47, total  protein 6.9, albumin 3.8.  Point-of-care marker is negative x4.   ASSESSMENT AND PLAN:  1. Unstable angina.  The patient has daily rest and exertional chest      pain since March 2010 and now has an abnormal cardiac CT suggesting      proximal LAD disease.  Plan to admit and rule out.  Cath on Monday.      We will add aspirin, beta-blocker, statin therapy.  2. Ethyl alcohol abuse.  DT prophylaxis.  Says he has no intention of      quitting.  3. Lipids, questionable status.  In light of abnormal CT, we will add      statin therapy.      Nicolasa Ducking, ANP      Noralyn Pick. Eden Emms, MD, [facility]  Electronically Signed    CB/MEDQ  D:  03/05/2009  T:  03/06/2009  Job:  [id number]

## 2011-03-03 ENCOUNTER — Other Ambulatory Visit: Payer: Self-pay | Admitting: *Deleted

## 2011-03-03 MED ORDER — AMLODIPINE BESYLATE 5 MG PO TABS: 5.0000 mg | ORAL_TABLET | Freq: Every day | ORAL | Status: DC

## 2011-03-16 ENCOUNTER — Encounter: Payer: Self-pay | Admitting: Cardiology

## 2011-05-12 ENCOUNTER — Other Ambulatory Visit: Payer: Self-pay | Admitting: Cardiology

## 2011-05-23 ENCOUNTER — Telehealth: Payer: Self-pay

## 2011-05-23 MED ORDER — AMLODIPINE BESYLATE 5 MG PO TABS: 5.0000 mg | ORAL_TABLET | Freq: Every day | ORAL | Status: DC

## 2011-05-23 NOTE — Telephone Encounter (Signed)
Refill sent for amlodipine 5 mg  

## 2011-12-21 ENCOUNTER — Encounter: Payer: Self-pay | Admitting: *Deleted

## 2011-12-28 ENCOUNTER — Institutional Professional Consult (permissible substitution): Payer: Self-pay | Admitting: Cardiovascular Disease

## 2011-12-29 ENCOUNTER — Encounter: Payer: Self-pay | Admitting: Cardiovascular Disease

## 2013-10-02 ENCOUNTER — Ambulatory Visit: Payer: Self-pay

## 2013-10-21 ENCOUNTER — Ambulatory Visit: Payer: Self-pay | Admitting: Emergency Medicine

## 2013-10-21 LAB — DOT URINE DIP
Blood: NEGATIVE
Glucose,UR: NEGATIVE mg/dL (ref 0–75)
Protein: NEGATIVE
Specific Gravity: 1.025 (ref 1.003–1.030)

## 2013-10-29 ENCOUNTER — Encounter: Payer: Self-pay | Admitting: Podiatry

## 2013-10-29 ENCOUNTER — Ambulatory Visit (INDEPENDENT_AMBULATORY_CARE_PROVIDER_SITE_OTHER): Payer: Self-pay | Admitting: Podiatry

## 2013-10-29 ENCOUNTER — Ambulatory Visit (INDEPENDENT_AMBULATORY_CARE_PROVIDER_SITE_OTHER): Payer: Self-pay

## 2013-10-29 VITALS — BP 127/81 | HR 101 | Resp 16 | Ht 71.0 in | Wt 247.0 lb

## 2013-10-29 DIAGNOSIS — M7750 Other enthesopathy of unspecified foot: Secondary | ICD-10-CM

## 2013-10-29 DIAGNOSIS — M21619 Bunion of unspecified foot: Secondary | ICD-10-CM

## 2013-10-29 DIAGNOSIS — M21622 Bunionette of left foot: Secondary | ICD-10-CM

## 2013-10-29 NOTE — Progress Notes (Signed)
   Subjective:    Patient ID: Samuel Heath, male    DOB: May 09, 1961, 53 y.o.   MRN: [id number]  HPI Comments: Left foot lateral side of foot pain. Burning pain, especially when wearing shoes . Tailors bunion .  Want a shot like he gave me last time   Foot Pain      Review of Systems  All other systems reviewed and are negative.       Objective:   Physical Exam: I have reviewed his past medical history medications allergies surgeries and social history. Review review of systems which are unremarkable. Pulses are strongly palpable left foot. Neurologic sensorium is intact. Orthopedic evaluation demonstrates Taylor's bunion deformity with underlying bursitis. He may very well have neuritis associated with this.        Assessment & Plan:  Assessment: Neuritis bursitis Taylor's bunion deformity fifth left.  Plan: Injection Kenalog local anesthetic followup with me as needed.

## 2013-12-09 ENCOUNTER — Other Ambulatory Visit: Payer: Self-pay | Admitting: Neurosurgery

## 2013-12-11 ENCOUNTER — Encounter (HOSPITAL_COMMUNITY): Payer: Self-pay | Admitting: *Deleted

## 2013-12-12 ENCOUNTER — Encounter (HOSPITAL_COMMUNITY): Payer: Self-pay | Admitting: *Deleted

## 2013-12-12 ENCOUNTER — Encounter (HOSPITAL_COMMUNITY)
Admission: RE | Disposition: A | Discharge status: Home / Self Care | Payer: Self-pay | Source: Ambulatory Visit | Attending: Neurosurgery

## 2013-12-12 ENCOUNTER — Ambulatory Visit (HOSPITAL_COMMUNITY): Payer: Self-pay | Admitting: Anesthesiology

## 2013-12-12 ENCOUNTER — Encounter (HOSPITAL_COMMUNITY): Payer: Self-pay | Admitting: Anesthesiology

## 2013-12-12 ENCOUNTER — Ambulatory Visit (HOSPITAL_COMMUNITY): Payer: Self-pay

## 2013-12-12 ENCOUNTER — Observation Stay (HOSPITAL_COMMUNITY): Payer: Self-pay

## 2013-12-12 ENCOUNTER — Ambulatory Visit (HOSPITAL_COMMUNITY)
Admission: RE | Admit: 2013-12-12 | Discharge: 2013-12-12 | Disposition: A | Discharge status: Home / Self Care | Payer: Self-pay | Source: Ambulatory Visit | Attending: Neurosurgery | Admitting: Neurosurgery

## 2013-12-12 DIAGNOSIS — E039 Hypothyroidism, unspecified: Secondary | ICD-10-CM | POA: Insufficient documentation

## 2013-12-12 DIAGNOSIS — K219 Gastro-esophageal reflux disease without esophagitis: Secondary | ICD-10-CM | POA: Insufficient documentation

## 2013-12-12 DIAGNOSIS — M47817 Spondylosis without myelopathy or radiculopathy, lumbosacral region: Secondary | ICD-10-CM | POA: Insufficient documentation

## 2013-12-12 DIAGNOSIS — I10 Essential (primary) hypertension: Secondary | ICD-10-CM | POA: Insufficient documentation

## 2013-12-12 DIAGNOSIS — I2510 Atherosclerotic heart disease of native coronary artery without angina pectoris: Secondary | ICD-10-CM | POA: Insufficient documentation

## 2013-12-12 DIAGNOSIS — M5126 Other intervertebral disc displacement, lumbar region: Secondary | ICD-10-CM | POA: Insufficient documentation

## 2013-12-12 DIAGNOSIS — K449 Diaphragmatic hernia without obstruction or gangrene: Secondary | ICD-10-CM | POA: Insufficient documentation

## 2013-12-12 HISTORY — PX: LUMBAR LAMINECTOMY/DECOMPRESSION MICRODISCECTOMY: SHX5026

## 2013-12-12 LAB — CBC
HCT: 43.4 % (ref 39.0–52.0)
Hemoglobin: 15.5 g/dL (ref 13.0–17.0)
MCH: 35.5 pg — ABNORMAL HIGH (ref 26.0–34.0)
MCHC: 35.7 g/dL (ref 30.0–36.0)
MCV: 99.3 fL (ref 78.0–100.0)
Platelets: 197 10*3/uL (ref 150–400)
RBC: 4.37 MIL/uL (ref 4.22–5.81)
RDW: 12.5 % (ref 11.5–15.5)
WBC: 5.9 10*3/uL (ref 4.0–10.5)

## 2013-12-12 LAB — SURGICAL PCR SCREEN
MRSA, PCR: NEGATIVE
Staphylococcus aureus: POSITIVE — AB

## 2013-12-12 LAB — COMPREHENSIVE METABOLIC PANEL
ALT: 50 U/L (ref 0–53)
AST: 35 U/L (ref 0–37)
Albumin: 4 g/dL (ref 3.5–5.2)
Alkaline Phosphatase: 54 U/L (ref 39–117)
BUN: 16 mg/dL (ref 6–23)
CO2: 25 mEq/L (ref 19–32)
Calcium: 9.5 mg/dL (ref 8.4–10.5)
Chloride: 101 mEq/L (ref 96–112)
Creatinine, Ser: 0.93 mg/dL (ref 0.50–1.35)
GFR calc Af Amer: >90 mL/min (ref >90)
GFR calc non Af Amer: >90 mL/min (ref >90)
Glucose, Bld: 102 mg/dL — ABNORMAL HIGH (ref 70–99)
Potassium: 4.7 mEq/L (ref 3.7–5.3)
Sodium: 140 mEq/L (ref 137–147)
Total Bilirubin: 0.4 mg/dL (ref 0.3–1.2)
Total Protein: 7.1 g/dL (ref 6.0–8.3)

## 2013-12-12 SURGERY — LUMBAR LAMINECTOMY/DECOMPRESSION MICRODISCECTOMY 1 LEVEL
Anesthesia: General | Site: Back | Laterality: Left

## 2013-12-12 MED ORDER — HEMOSTATIC AGENTS (NO CHARGE) OPTIME
TOPICAL | Status: DC | PRN
  Administered 2013-12-12: 1 via TOPICAL

## 2013-12-12 MED ORDER — PHENOL 1.4 % MT LIQD: 1.0000 | OROMUCOSAL | Status: DC | PRN

## 2013-12-12 MED ORDER — NEOSTIGMINE METHYLSULFATE 1 MG/ML IJ SOLN
INTRAMUSCULAR | Status: DC | PRN
  Administered 2013-12-12: 5 mg via INTRAVENOUS

## 2013-12-12 MED ORDER — CEFAZOLIN SODIUM 1-5 GM-% IV SOLN: 1.0000 g | Freq: Three times a day (TID) | INTRAVENOUS | Status: DC

## 2013-12-12 MED ORDER — LIDOCAINE HCL (CARDIAC) 20 MG/ML IV SOLN
INTRAVENOUS | Status: DC | PRN
  Administered 2013-12-12: 80 mg via INTRAVENOUS

## 2013-12-12 MED ORDER — CEFAZOLIN SODIUM-DEXTROSE 2-3 GM-% IV SOLR: 2.0000 g | INTRAVENOUS | Status: DC

## 2013-12-12 MED ORDER — LIDOCAINE-EPINEPHRINE 1 %-1:100000 IJ SOLN
INTRAMUSCULAR | Status: DC | PRN
  Administered 2013-12-12: 3.5 mL

## 2013-12-12 MED ORDER — NEOSTIGMINE METHYLSULFATE 1 MG/ML IJ SOLN
INTRAMUSCULAR | Status: AC
  Filled 2013-12-12: qty 10

## 2013-12-12 MED ORDER — CEFAZOLIN SODIUM-DEXTROSE 2-3 GM-% IV SOLR
INTRAVENOUS | Status: AC
  Administered 2013-12-12: 2 g via INTRAVENOUS
  Filled 2013-12-12: qty 50

## 2013-12-12 MED ORDER — LISINOPRIL 5 MG PO TABS
5.0000 mg | ORAL_TABLET | Freq: Every day | ORAL | Status: DC
  Filled 2013-12-12: qty 1

## 2013-12-12 MED ORDER — SODIUM CHLORIDE 0.9 % IV SOLN: 250.0000 mL | INTRAVENOUS | Status: DC

## 2013-12-12 MED ORDER — PROPOFOL 10 MG/ML IV BOLUS
INTRAVENOUS | Status: AC
  Filled 2013-12-12: qty 20

## 2013-12-12 MED ORDER — ACETAMINOPHEN 650 MG RE SUPP: 650.0000 mg | RECTAL | Status: DC | PRN

## 2013-12-12 MED ORDER — SODIUM CHLORIDE 0.9 % IJ SOLN: 3.0000 mL | INTRAMUSCULAR | Status: DC | PRN

## 2013-12-12 MED ORDER — OXYCODONE-ACETAMINOPHEN 5-325 MG PO TABS: 1.0000 | ORAL_TABLET | ORAL | Status: DC | PRN

## 2013-12-12 MED ORDER — ONDANSETRON HCL 4 MG/2ML IJ SOLN
INTRAMUSCULAR | Status: AC
  Filled 2013-12-12: qty 2

## 2013-12-12 MED ORDER — SENNA 8.6 MG PO TABS: 1.0000 | ORAL_TABLET | Freq: Two times a day (BID) | ORAL | Status: DC

## 2013-12-12 MED ORDER — MIDAZOLAM HCL 5 MG/5ML IJ SOLN
INTRAMUSCULAR | Status: DC | PRN
  Administered 2013-12-12: 2 mg via INTRAVENOUS

## 2013-12-12 MED ORDER — SODIUM CHLORIDE 0.9 % IJ SOLN
3.0000 mL | Freq: Two times a day (BID) | INTRAMUSCULAR | Status: DC
Start: 2013-12-12 — End: 2013-12-12

## 2013-12-12 MED ORDER — GELATIN ABSORBABLE MT POWD
OROMUCOSAL | Status: DC | PRN
  Administered 2013-12-12: 12:00:00 via TOPICAL

## 2013-12-12 MED ORDER — MUPIROCIN 2 % EX OINT
TOPICAL_OINTMENT | Freq: Two times a day (BID) | CUTANEOUS | Status: DC
  Administered 2013-12-12: 08:00:00 via NASAL
  Filled 2013-12-12: qty 22

## 2013-12-12 MED ORDER — FENTANYL CITRATE 0.05 MG/ML IJ SOLN
INTRAMUSCULAR | Status: DC | PRN
  Administered 2013-12-12: 50 ug via INTRAVENOUS
  Administered 2013-12-12 (×2): 100 ug via INTRAVENOUS
  Administered 2013-12-12: 50 ug via INTRAVENOUS
  Administered 2013-12-12: 100 ug via INTRAVENOUS

## 2013-12-12 MED ORDER — SODIUM CHLORIDE 0.9 % IV SOLN: INTRAVENOUS | Status: DC

## 2013-12-12 MED ORDER — THROMBIN 5000 UNITS EX SOLR
CUTANEOUS | Status: DC | PRN
  Administered 2013-12-12 (×2): 5000 [IU] via TOPICAL

## 2013-12-12 MED ORDER — MORPHINE SULFATE 2 MG/ML IJ SOLN: 1.0000 mg | INTRAMUSCULAR | Status: DC | PRN

## 2013-12-12 MED ORDER — SODIUM CHLORIDE 0.9 % IR SOLN
Status: DC | PRN
  Administered 2013-12-12: 11:00:00

## 2013-12-12 MED ORDER — OXYCODONE HCL 5 MG PO TABS: 5.0000 mg | ORAL_TABLET | Freq: Once | ORAL | Status: DC | PRN

## 2013-12-12 MED ORDER — GLYCOPYRROLATE 0.2 MG/ML IJ SOLN
INTRAMUSCULAR | Status: DC | PRN
  Administered 2013-12-12: .8 mg via INTRAVENOUS

## 2013-12-12 MED ORDER — ACETAMINOPHEN 325 MG PO TABS: 650.0000 mg | ORAL_TABLET | ORAL | Status: DC | PRN

## 2013-12-12 MED ORDER — FENTANYL CITRATE 0.05 MG/ML IJ SOLN
INTRAMUSCULAR | Status: AC
  Filled 2013-12-12: qty 5

## 2013-12-12 MED ORDER — MIDAZOLAM HCL 2 MG/2ML IJ SOLN
INTRAMUSCULAR | Status: AC
Start: 2013-12-12 — End: 2013-12-12
  Filled 2013-12-12: qty 2

## 2013-12-12 MED ORDER — PHENYLEPHRINE 40 MCG/ML (10ML) SYRINGE FOR IV PUSH (FOR BLOOD PRESSURE SUPPORT)
PREFILLED_SYRINGE | INTRAVENOUS | Status: AC
  Filled 2013-12-12: qty 10

## 2013-12-12 MED ORDER — OXYCODONE HCL 5 MG/5ML PO SOLN: 5.0000 mg | Freq: Once | ORAL | Status: DC | PRN

## 2013-12-12 MED ORDER — HYDROMORPHONE HCL PF 1 MG/ML IJ SOLN: 0.2500 mg | INTRAMUSCULAR | Status: DC | PRN

## 2013-12-12 MED ORDER — ONDANSETRON HCL 4 MG/2ML IJ SOLN: 4.0000 mg | Freq: Four times a day (QID) | INTRAMUSCULAR | Status: DC | PRN

## 2013-12-12 MED ORDER — DIAZEPAM 5 MG PO TABS: 5.0000 mg | ORAL_TABLET | Freq: Four times a day (QID) | ORAL | Status: DC | PRN

## 2013-12-12 MED ORDER — ONDANSETRON HCL 4 MG/2ML IJ SOLN: 4.0000 mg | INTRAMUSCULAR | Status: DC | PRN

## 2013-12-12 MED ORDER — ROCURONIUM BROMIDE 100 MG/10ML IV SOLN
INTRAVENOUS | Status: DC | PRN
  Administered 2013-12-12: 50 mg via INTRAVENOUS

## 2013-12-12 MED ORDER — BUPIVACAINE HCL (PF) 0.5 % IJ SOLN
INTRAMUSCULAR | Status: DC | PRN
  Administered 2013-12-12: 3.5 mL

## 2013-12-12 MED ORDER — 0.9 % SODIUM CHLORIDE (POUR BTL) OPTIME
TOPICAL | Status: DC | PRN
  Administered 2013-12-12: 1000 mL

## 2013-12-12 MED ORDER — HEPARIN SODIUM (PORCINE) 5000 UNIT/ML IJ SOLN
5000.0000 [IU] | Freq: Three times a day (TID) | INTRAMUSCULAR | Status: DC
  Filled 2013-12-12: qty 1

## 2013-12-12 MED ORDER — METHYLPREDNISOLONE ACETATE 80 MG/ML IJ SUSP
INTRAMUSCULAR | Status: DC | PRN
  Administered 2013-12-12: 80 mg

## 2013-12-12 MED ORDER — ROCURONIUM BROMIDE 50 MG/5ML IV SOLN
INTRAVENOUS | Status: AC
  Filled 2013-12-12: qty 1

## 2013-12-12 MED ORDER — LIDOCAINE HCL (CARDIAC) 20 MG/ML IV SOLN
INTRAVENOUS | Status: AC
  Filled 2013-12-12: qty 5

## 2013-12-12 MED ORDER — DOCUSATE SODIUM 100 MG PO CAPS
100.0000 mg | ORAL_CAPSULE | Freq: Two times a day (BID) | ORAL | Status: DC
  Filled 2013-12-12 (×2): qty 1

## 2013-12-12 MED ORDER — KETOROLAC TROMETHAMINE 0.5 % OP SOLN
1.0000 [drp] | Freq: Four times a day (QID) | OPHTHALMIC | Status: DC | PRN
  Administered 2013-12-12: 1 [drp] via OPHTHALMIC
  Filled 2013-12-12: qty 3

## 2013-12-12 MED ORDER — PANTOPRAZOLE SODIUM 40 MG PO TBEC: 80.0000 mg | DELAYED_RELEASE_TABLET | Freq: Every day | ORAL | Status: DC

## 2013-12-12 MED ORDER — ONDANSETRON HCL 4 MG/2ML IJ SOLN
INTRAMUSCULAR | Status: DC | PRN
Start: 2013-12-12 — End: 2013-12-12
  Administered 2013-12-12: 4 mg via INTRAVENOUS

## 2013-12-12 MED ORDER — LACTATED RINGERS IV SOLN
INTRAVENOUS | Status: DC | PRN
  Administered 2013-12-12 (×2): via INTRAVENOUS

## 2013-12-12 MED ORDER — LACTATED RINGERS IV SOLN
INTRAVENOUS | Status: DC
  Administered 2013-12-12: 08:00:00 via INTRAVENOUS

## 2013-12-12 MED ORDER — ESMOLOL HCL 10 MG/ML IV SOLN
INTRAVENOUS | Status: DC | PRN
  Administered 2013-12-12: 20 mg via INTRAVENOUS

## 2013-12-12 MED ORDER — PHENYLEPHRINE HCL 10 MG/ML IJ SOLN
INTRAMUSCULAR | Status: DC | PRN
  Administered 2013-12-12: 80 ug via INTRAVENOUS
  Administered 2013-12-12: 40 ug via INTRAVENOUS
  Administered 2013-12-12: 80 ug via INTRAVENOUS

## 2013-12-12 MED ORDER — OXYCODONE-ACETAMINOPHEN 10-325 MG PO TABS: 1.0000 | ORAL_TABLET | ORAL | Status: DC | PRN

## 2013-12-12 MED ORDER — GLYCOPYRROLATE 0.2 MG/ML IJ SOLN
INTRAMUSCULAR | Status: AC
  Filled 2013-12-12: qty 4

## 2013-12-12 MED ORDER — MENTHOL 3 MG MT LOZG: 1.0000 | LOZENGE | OROMUCOSAL | Status: DC | PRN

## 2013-12-12 MED ORDER — PROPOFOL 10 MG/ML IV BOLUS
INTRAVENOUS | Status: DC | PRN
  Administered 2013-12-12: 200 mg via INTRAVENOUS

## 2013-12-12 SURGICAL SUPPLY — 74 items
ADH SKN CLS APL DERMABOND .7 (GAUZE/BANDAGES/DRESSINGS) ×1
ADH SKN CLS LQ APL DERMABOND (GAUZE/BANDAGES/DRESSINGS) ×1
APL SKNCLS STERI-STRIP NONHPOA (GAUZE/BANDAGES/DRESSINGS)
BAG DECANTER FOR FLEXI CONT (MISCELLANEOUS) ×2 IMPLANT
BENZOIN TINCTURE PRP APPL 2/3 (GAUZE/BANDAGES/DRESSINGS) IMPLANT
BLADE SURG 11 STRL SS (BLADE) ×2 IMPLANT
BLADE SURG ROTATE 9660 (MISCELLANEOUS) IMPLANT
BUR MATCHSTICK NEURO 3.0 LAGG (BURR) ×2 IMPLANT
CANISTER SUCT 3000ML (MISCELLANEOUS) ×2 IMPLANT
CONT SPEC 4OZ CLIKSEAL STRL BL (MISCELLANEOUS) ×2 IMPLANT
DECANTER SPIKE VIAL GLASS SM (MISCELLANEOUS) ×2 IMPLANT
DERMABOND ADHESIVE PROPEN (GAUZE/BANDAGES/DRESSINGS) ×1
DERMABOND ADVANCED (GAUZE/BANDAGES/DRESSINGS) ×1
DERMABOND ADVANCED .7 DNX12 (GAUZE/BANDAGES/DRESSINGS) ×1 IMPLANT
DERMABOND ADVANCED .7 DNX6 (GAUZE/BANDAGES/DRESSINGS) IMPLANT
DRAPE LAPAROTOMY 100X72X124 (DRAPES) ×2 IMPLANT
DRAPE MICROSCOPE LEICA (MISCELLANEOUS) ×2 IMPLANT
DRAPE POUCH INSTRU U-SHP 10X18 (DRAPES) ×2 IMPLANT
DRAPE PROXIMA HALF (DRAPES) ×1 IMPLANT
DRAPE SURG 17X23 STRL (DRAPES) ×2 IMPLANT
DRSG OPSITE POSTOP 3X4 (GAUZE/BANDAGES/DRESSINGS) ×2 IMPLANT
DURAPREP 26ML APPLICATOR (WOUND CARE) ×2 IMPLANT
ELECT BLADE 4.0 EZ CLEAN MEGAD (MISCELLANEOUS) ×2
ELECT REM PT RETURN 9FT ADLT (ELECTROSURGICAL) ×2
ELECTRODE BLDE 4.0 EZ CLN MEGD (MISCELLANEOUS) IMPLANT
ELECTRODE REM PT RTRN 9FT ADLT (ELECTROSURGICAL) ×1 IMPLANT
GAUZE SPONGE 4X4 16PLY XRAY LF (GAUZE/BANDAGES/DRESSINGS) IMPLANT
GLOVE BIOGEL M 8.0 STRL (GLOVE) ×1 IMPLANT
GLOVE BIOGEL PI IND STRL 7.0 (GLOVE) IMPLANT
GLOVE BIOGEL PI IND STRL 7.5 (GLOVE) ×1 IMPLANT
GLOVE BIOGEL PI INDICATOR 7.0 (GLOVE) ×1
GLOVE BIOGEL PI INDICATOR 7.5 (GLOVE) ×1
GLOVE ECLIPSE 7.0 STRL STRAW (GLOVE) ×2 IMPLANT
GLOVE EXAM NITRILE LRG STRL (GLOVE) IMPLANT
GLOVE EXAM NITRILE MD LF STRL (GLOVE) IMPLANT
GLOVE EXAM NITRILE XL STR (GLOVE) IMPLANT
GLOVE EXAM NITRILE XS STR PU (GLOVE) IMPLANT
GLOVE INDICATOR 8.5 STRL (GLOVE) ×1 IMPLANT
GLOVE SS N UNI LF 7.0 STRL (GLOVE) ×3 IMPLANT
GOWN BRE IMP SLV AUR LG STRL (GOWN DISPOSABLE) ×2 IMPLANT
GOWN BRE IMP SLV AUR XL STRL (GOWN DISPOSABLE) IMPLANT
GOWN STRL REIN 2XL LVL4 (GOWN DISPOSABLE) IMPLANT
GOWN STRL REUS W/ TWL LRG LVL3 (GOWN DISPOSABLE) IMPLANT
GOWN STRL REUS W/ TWL XL LVL3 (GOWN DISPOSABLE) IMPLANT
GOWN STRL REUS W/TWL LRG LVL3 (GOWN DISPOSABLE) ×2
GOWN STRL REUS W/TWL XL LVL3 (GOWN DISPOSABLE) ×4
HEMOSTAT POWDER KIT SURGIFOAM (HEMOSTASIS) ×2 IMPLANT
KIT BASIN OR (CUSTOM PROCEDURE TRAY) ×2 IMPLANT
KIT ROOM TURNOVER OR (KITS) ×2 IMPLANT
NDL HYPO 18GX1.5 BLUNT FILL (NEEDLE) IMPLANT
NDL HYPO 25X1 1.5 SAFETY (NEEDLE) ×1 IMPLANT
NDL SPNL 18GX3.5 QUINCKE PK (NEEDLE) IMPLANT
NEEDLE HYPO 18GX1.5 BLUNT FILL (NEEDLE) ×2 IMPLANT
NEEDLE HYPO 25X1 1.5 SAFETY (NEEDLE) ×2 IMPLANT
NEEDLE SPNL 18GX3.5 QUINCKE PK (NEEDLE) IMPLANT
NS IRRIG 1000ML POUR BTL (IV SOLUTION) ×2 IMPLANT
PACK LAMINECTOMY NEURO (CUSTOM PROCEDURE TRAY) ×2 IMPLANT
PAD ARMBOARD 7.5X6 YLW CONV (MISCELLANEOUS) ×6 IMPLANT
RUBBERBAND STERILE (MISCELLANEOUS) ×4 IMPLANT
SPONGE GAUZE 4X4 12PLY (GAUZE/BANDAGES/DRESSINGS) IMPLANT
SPONGE LAP 4X18 X RAY DECT (DISPOSABLE) IMPLANT
SPONGE SURGIFOAM ABS GEL SZ50 (HEMOSTASIS) ×2 IMPLANT
STRIP CLOSURE SKIN 1/2X4 (GAUZE/BANDAGES/DRESSINGS) IMPLANT
SUT VIC AB 0 CT1 18XCR BRD8 (SUTURE) ×1 IMPLANT
SUT VIC AB 0 CT1 8-18 (SUTURE) ×2
SUT VIC AB 2-0 CT1 18 (SUTURE) IMPLANT
SUT VIC AB 3-0 FS2 27 (SUTURE) IMPLANT
SUT VIC AB 3-0 SH 8-18 (SUTURE) ×2 IMPLANT
SUT VICRYL 3-0 RB1 18 ABS (SUTURE) ×1 IMPLANT
SYR 20ML ECCENTRIC (SYRINGE) ×2 IMPLANT
SYR 3ML LL SCALE MARK (SYRINGE) ×1 IMPLANT
TOWEL OR 17X24 6PK STRL BLUE (TOWEL DISPOSABLE) ×2 IMPLANT
TOWEL OR 17X26 10 PK STRL BLUE (TOWEL DISPOSABLE) ×2 IMPLANT
WATER STERILE IRR 1000ML POUR (IV SOLUTION) ×2 IMPLANT

## 2013-12-12 NOTE — Anesthesia Preprocedure Evaluation (Signed)
Anesthesia Evaluation  Patient identified by MRN, date of birth, ID band Patient awake    Reviewed: Allergy & Precautions, H&P , NPO status , Patient's Chart, lab work & pertinent test results  Airway Mallampati: II  Neck ROM: full    Dental   Pulmonary          Cardiovascular hypertension, + CAD  CAD medically managed.   Neuro/Psych    GI/Hepatic hiatal hernia, GERD-  ,  Endo/Other  Hypothyroidism obese  Renal/GU      Musculoskeletal   Abdominal   Peds  Hematology   Anesthesia Other Findings   Reproductive/Obstetrics                           Anesthesia Physical Anesthesia Plan  ASA: III  Anesthesia Plan: General   Post-op Pain Management:    Induction: Intravenous  Airway Management Planned: Oral ETT  Additional Equipment:   Intra-op Plan:   Post-operative Plan: Extubation in OR  Informed Consent: I have reviewed the patients History and Physical, chart, labs and discussed the procedure including the risks, benefits and alternatives for the proposed anesthesia with the patient or authorized representative who has indicated his/her understanding and acceptance.     Plan Discussed with: CRNA, Anesthesiologist and Surgeon  Anesthesia Plan Comments:         Anesthesia Quick Evaluation

## 2013-12-12 NOTE — Discharge Summary (Signed)
  Physician Discharge Summary  Patient ID: Samuel Heath MRN: [id number] DOB/AGE: [birth year] 53 y.o.  Admit date: 12/12/2013 Discharge date: 12/12/2013  Admission Diagnoses: Lumbar disc herniation with radiculopathy, L4-5  Discharge Diagnoses: Same Active Problems:   HNP (herniated nucleus pulposus), lumbar   Discharged Condition: Stable  Hospital Course:  Mrs. Samuel Heath is a 53 y.o. male who presented to the clinic with left L5 radiculopathy and MRI demonstrating L4-5 disc herniation. The patient was admitted for elective left L4-5 laminotomy and microdiscectomy which was done without complication. On POD#1 the patient was at his neurologic baseline. Back pain was controlled with oral medication, he was ambulating without difficulty, voiding normally, and tolerating diet.  Treatments: Surgery - left L4-5 laminotomy, microdiscectomy  Discharge Exam: Blood pressure 147/94, pulse 99, temperature 97.8 F (36.6 C), temperature source Oral, resp. rate 20, height 5\' 10"  (1.778 m), weight 113.399 kg (250 lb), SpO2 98.00%. Awake, alert, oriented Speech fluent, appropriate CN grossly intact 5/5 BUE/BLE Wound c/d/i  Follow-up: Follow-up in my office [facility] [phone]) in 2-3 weeks  Disposition:      Medication List         aspirin 81 MG EC tablet  Take 81 mg by mouth daily.     diazepam 5 MG tablet  Commonly known as:  VALIUM  Take 1 tablet (5 mg total) by mouth every 6 (six) hours as needed for anxiety.     Fish Oil Oil  Take 1 capsule by mouth daily.     lisinopril 5 MG tablet  Commonly known as:  PRINIVIL,ZESTRIL  Take 5 mg by mouth daily.     omeprazole 40 MG capsule  Commonly known as:  PRILOSEC  Take 40 mg by mouth daily.     oxyCODONE-acetaminophen 10-325 MG per tablet  Commonly known as:  PERCOCET  Take 1 tablet by mouth every 4 (four) hours as needed for pain.     VITAMIN B 12 PO  Take 1 tablet by mouth daily.          SignedConsuella Lose 12/12/2013, 1:11 PM

## 2013-12-12 NOTE — Anesthesia Procedure Notes (Signed)
Procedure Name: Intubation Date/Time: 12/12/2013 10:20 AM Performed by: Ollen Bowl Pre-anesthesia Checklist: Emergency Drugs available, Patient identified, Timeout performed, Suction available and Patient being monitored Patient Re-evaluated:Patient Re-evaluated prior to inductionOxygen Delivery Method: Circle system utilized and Simple face mask Preoxygenation: Pre-oxygenation with 100% oxygen Intubation Type: IV induction Ventilation: Mask ventilation without difficulty Laryngoscope Size: Miller and 3 Grade View: Grade III Tube type: Oral Tube size: 7.5 mm Number of attempts: 1 Airway Equipment and Method: Patient positioned with wedge pillow and Stylet Placement Confirmation: ETT inserted through vocal cords under direct vision,  positive ETCO2 and breath sounds checked- equal and bilateral Secured at: 23 cm Tube secured with: Tape Dental Injury: Teeth and Oropharynx as per pre-operative assessment

## 2013-12-12 NOTE — Progress Notes (Signed)
Pt doing very well. Pt pain is controlled, he has voided without difficulty, and ambulated in the hall without assistance. Pt and wife given D/C instructions with Rx's, verbal understanding was given. Pt D/C'd home via wheelchair @ 1605 per MD order. Pt is stable @ D/C and has no other needs. Holli Humbles, RN

## 2013-12-12 NOTE — Progress Notes (Signed)
Dr Meredith Mody at bedside and eye drops ordered

## 2013-12-12 NOTE — Progress Notes (Signed)
Patient has been assessed for obstructive sleep apnea during a preop visit.  According to our [facility] tool pt is at a higher risk

## 2013-12-12 NOTE — Addendum Note (Signed)
Addendum created 12/12/13 1448 by Ollen Bowl, CRNA   Modules edited: Anesthesia Medication Administration

## 2013-12-12 NOTE — H&P (Addendum)
History of Present Illness: 1.  Leg pain  Mr. Samuel Heath is a 53 year old male initially seen in the office.  His wife is a previous patient of mine who is recently undergone microdiscectomy.  He comes in complaining of several month history of left sided leg pain primarily.  He says that the pain is a sharp sensation primarily in the left hip which radiates into the lateral aspect of the left thigh and into his left calf.  He also describes some tingling in the same distribution.  The pain is intermittent, but it occurs primarily when he is driving pushing in the clutch of his vehicle.  He denies any symptoms in the right leg.  He has no bowel or bladder symptoms.  In addition to the pain, he has also noticed his left foot is dragging.  He notices that he will drag his toes while he is walking.  He also describes some symptoms of pain in the back as well as pain in both legs when he stands for any length of time which is relieved when he punches over.  This is a problem which is more chronic in nature, which he has had for several years.        PAST MEDICAL/SURGICAL HISTORY   (Detailed)  Disease/disorder Onset Date Management Date Comments  Hypertension          PAST MEDICAL HISTORY, SURGICAL HISTORY, FAMILY HISTORY, SOCIAL HISTORY AND REVIEW OF SYSTEMS I have reviewed the patient's past medical, surgical, family and social history as well as the comprehensive review of systems as included on the [facility] history form dated 11/03/2013, which I have signed.  Family History  (Detailed) Patient reports there is no relevant family history.   SOCIAL HISTORY  (Detailed) Tobacco use reviewed. Preferred language is Unknown.   Smoking status: Never smoker.  SMOKING STATUS Use Status Type Smoking Status Usage Per Day Years Used Total Pack Years  no/never  Never smoker       HOME ENVIRONMENT/SAFETY The patient has not fallen in the last year.         MEDICATIONS(added, continued or stopped this visit):   Started Medication Directions Instruction Stopped   lisinopril-hydrochlorothiazide take 1 tablet by oral route  every day    11/03/2013 Percocet 5 mg-325 mg tablet take 1 tablet by oral route  every 6 hours as needed      ALLERGIES:  Ingredient Reaction Medication Name Comment  NO KNOWN ALLERGIES     No known allergies.  REVIEW OF SYSTEMS System Neg/Pos Details  Constitutional Negative Chills, fatigue, fever, malaise, night sweats, weight gain and weight loss.  ENMT Negative Ear drainage, hearing loss, nasal drainage, otalgia, sinus pressure and sore throat.  Eyes Negative Eye discharge, eye pain and vision changes.  Respiratory Negative Chronic cough, cough, dyspnea, known TB exposure and wheezing.  Cardio Positive High blood pressure, Leg pain while walking.  Cardio Negative Chest pain, claudication, edema and irregular heartbeat/palpitations.  GI Negative Abdominal pain, blood in stool, change in stool pattern, constipation, decreased appetite, diarrhea, heartburn, nausea and vomiting.  GU Negative Dribbling, dysuria, erectile dysfunction, hematuria, polyuria, slow stream, urinary frequency, urinary incontinence and urinary retention.  Endocrine Negative Cold intolerance, heat intolerance, polydipsia and polyphagia.  Neuro Positive Extremity weakness.  Neuro Negative Dizziness, gait disturbance, headache, memory impairment, numbness in extremity, seizures and tremors.  Psych Negative Anxiety, depression and insomnia.  Integumentary Negative Brittle hair, brittle nails, change in shape/size of mole(s), hair loss,  hirsutism, hives, pruritus, rash and skin lesion.  MS Positive Arm pain.  MS Negative Back pain, joint pain, joint swelling, muscle weakness and neck pain.  Hema/Lymph Negative Easy bleeding, easy bruising and lymphadenopathy.  Allergic/Immuno Negative Contact allergy, environmental allergies, food  allergies and seasonal allergies.  Reproductive Negative Penile discharge and sexual dysfunction.    Vitals Date Temp F BP Pulse Ht In Wt Lb BMI BSA Pain Score  11/03/2013  150/106 91 71 254 35.43  3/10     PHYSICAL EXAM General General Appearance: normal Mood/Affect: normal Orientation: normal Pulses/Edema: 2+ bilateral radial / DP pulses Gait/Station: non-antalgic, normal toe walking, cannot heel-walk normal tandem gait Coordination: normal    Skin Right Upper Extremity: normal Left Upper Extremity: normal Right Lower Extremity: normal Left Lower Extremity: normal  Inspection/Palpation   Right Left  Lumbar Spine: normal normal Upper Extremity: normal normal Lower Extremity: normal normal  Stability Cervical Spine: normal Right Upper Extremity: normal Left Upper Extremity: normal Right Lower Extremity: normal Left Lower Extremity: normal  Range of Motion Cervical Spine: normal Right Upper Extremity: normal Left Upper Extremity: normal Right Lower Extremity: normal Left Lower Extremity: normal  Motor Strength Upper and lower extremity motor strength was tested in the clinically pertinent muscles .Any abnormal findings will be noted below..   Right Left Deltoid: normal normal Biceps: normal normal Triceps: normal normal Infraspinatus: normal normal Wrist Extensor: normal normal Grip: normal normal Hip Flexor: normal normal Knee Extensor: normal normal Tib Anterior: normal 4/5 EHL: normal 4/5 Medial Gastroc: normal normal  Sensory Sensation was tested at C5 to T1 and L2 to S1 .Any abnormal findings will be noted below..  Right Left C5: normal normal  C6: normal normal C7: normal normal C8: normal normal T1: normal normal Median Hand: normal normal   Ulnar Hand: normal normal   L2: normal normal  L3: normal normal  L4: normal normal  L5: normal normal  S1: normal normal  Motor and other  Tests    Right Left Hoffman's: absent absent Clonus: absent absent Babinski: downgoing downgoing SLR: negative negative  Muscle Stretch Reflexes Upper and lower extremity reflexes were tested in the clinically pertinent muscles .Any abnormal findings will be noted below..  Right Left Bicep: normal normal Tricep: normal normal Brachioradialis: normal normal Patellar: normal normal Achilles: normal normal      DIAGNOSTIC RESULTS MRI of the lumbar spine was reviewed which demonstrates multilevel lumbar spondylosis, with normal alignment.  There is disc degeneration and loss of height at L2-3 with broad-based disc bulge in association with facet hypertrophy and ligamentous hypertrophy causing moderate to severe spinal stenosis.  At L3 L4 there is similar finding with broad-based disc bulge and moderate spinal stenosis.  At L4 L5 there is a left eccentric disc herniation with likely compression of the traversing left L5 nerve root.  There is superimposed disc degeneration with loss of height and facet hypertrophy at this level also.    IMPRESSION 53 year old man with clinical complaint of primarily left L5 radiculopathy, and multilevel lumbar spondylosis with moderate to severe stenosis of multifactorial origin, with a left L4 L5 disc herniation likely responsible for his presenting symptoms.  PLAN - Left L4-L5 microdiscectomy  The radiographic findings and general treatment options were discussed in detail with the patient and his wife in the office.  I reviewed the option for conservative management which would include pain medication and a course of physical therapy.  The risks of conservative management were also reviewed including the possibility  of persistent or increased pain, and possibility of decreased likelihood of good surgical outcome should he require it after conservative measures.  The risks of the surgery were discussed in detail, including but not limited to bleeding,  infection, nerve injury resulting in leg/foot weakness or bowel/bladder dysfunction, and CSF leak. Possible outcomes of surgery were also discussed including the possibility of uncomplicated surgery but persistence of pain symptoms.

## 2013-12-12 NOTE — Progress Notes (Signed)
Pt. coing of right eye itching and burning, Dr. Meredith Mody made aware

## 2013-12-12 NOTE — Progress Notes (Signed)
Earring cut off of ear.  Small nick in ear lobe cleaned and pt denies problems.

## 2013-12-12 NOTE — Transfer of Care (Signed)
Immediate Anesthesia Transfer of Care Note  Patient: Samuel Heath  Procedure(s) Performed: Procedure(s) with comments: LUMBAR LAMINECTOMY/DECOMPRESSION MICRODISCECTOMY 1 LEVEL (Left) - Left L45 microdiskectomy  Patient Location: PACU  Anesthesia Type:General  Level of Consciousness: awake, alert  and oriented  Airway & Oxygen Therapy: Patient Spontanous Breathing and Patient connected to nasal cannula oxygen  Post-op Assessment: Report given to PACU RN, Post -op Vital signs reviewed and stable and Patient moving all extremities X 4  Post vital signs: Reviewed and stable  Complications: No apparent anesthesia complications

## 2013-12-12 NOTE — Anesthesia Postprocedure Evaluation (Signed)
Anesthesia Post Note  Patient: Samuel Heath  Procedure(s) Performed: Procedure(s) (LRB): LUMBAR LAMINECTOMY/DECOMPRESSION MICRODISCECTOMY 1 LEVEL (Left)  Anesthesia type: General  Patient location: PACU  Post pain: Pain level controlled and Adequate analgesia  Post assessment: Post-op Vital signs reviewed, Patient's Cardiovascular Status Stable, Respiratory Function Stable, Patent Airway and Pain level controlled  Last Vitals:  Filed Vitals:   12/12/13 1343  BP: 128/73  Pulse:   Temp: 36 C  Resp:     Post vital signs: Reviewed and stable  Level of consciousness: awake, alert  and oriented  Complications: No apparent anesthesia complications

## 2013-12-12 NOTE — Op Note (Signed)
PREOP DIAGNOSIS: Lumbar disc herniation, L4-5  POSTOP DIAGNOSIS: Same  PROCEDURE: 1. Left L4-5 laminotomy and microdiscectomy for decompression of nerve root 2. Use of operating microscope  SURGEON: Dr. Consuella Lose, MD  ASSISTANT: Dr. Kary Kos, MD  ANESTHESIA: General Endotracheal  EBL: 50cc  SPECIMENS: None  DRAINS: None  COMPLICATIONS: None  CONDITION: Hemodynamically stable to PACU  HISTORY: Samuel Heath is a 53 y.o. male who presented to the outpatient clinic with left-sided leg pain consistent with a left L5 radiculopathy. MRI demonstrated multiple levels of moderate to severe central stenosis, with a left-sided L4-5 disc herniation with compression of the traversing left L5 nerve root. It was felt that this was likely responsible for his primary complaint, and treatment options were discussed including conservative management versus surgical decompression. After the risks and benefits of each were explained, the patient elected to proceed with surgery.  PROCEDURE IN DETAIL: After informed consent was obtained and witnessed, the patient was brought to the operating room. After induction of general anesthesia, the patient was positioned on the operative table in the prone position with all pressure points meticulously padded. The skin of the low back was then prepped and draped in the usual sterile fashion.  Under xray, the correct level was identified and marked out on the skin, and after timeout was conducted, the skin was infiltrated with local anesthetic. Skin incision was then made sharply and Bovie electrocautery was used to dissect the subcutaneous tissue until the lumbodorsal fascia was identified. The fascia was then incised using Bovie electrocautery and the lamina at the L4 level was identified and dissection was carried out in the subperiosteal plane. Self-retaining retractor was then placed, and intraoperative x-ray was taken to confirm we were at the correct  level.  Using a high-speed drill, laminotomy was completed with a partial medial facetectomy. The ligamentum flavum was then identified and removed and the lateral edge of the traversing L5 nerve root was identified. Dissection was then carried out lateral to the nerve root to identify the disc space. The posterior annulus was then incised and using a combination of dissectors, curettes, and rongeurs portions, the herniated disc was removed. The disc was found to be calcified, and was very difficult to remove. The decompression of the nerve root was confirmed using a dissector.  Hemostasis was then secured using a combination of morcellized Gelfoam and thrombin and bipolar electrocautery. The wound is irrigated with copious amounts of antibiotic saline irrigation. The nerve root was then covered with a long-acting steroid solution. Self-retaining retractor was then removed, and the wound is closed in layers using a combination of interrupted 0 Vicryl and 3-0 Vicryl stitches. The skin was closed using standard skin glue.  At the end of the case all sponge, needle, and instrument counts were correct. The patient was then transferred to the stretcher and taken to the postanesthesia care unit in stable hemodynamic condition.

## 2013-12-16 ENCOUNTER — Encounter (HOSPITAL_COMMUNITY): Payer: Self-pay | Admitting: Neurosurgery

## 2014-04-23 ENCOUNTER — Encounter: Payer: Self-pay | Admitting: Podiatry

## 2014-04-23 ENCOUNTER — Ambulatory Visit (INDEPENDENT_AMBULATORY_CARE_PROVIDER_SITE_OTHER): Payer: BC Managed Care – PPO | Admitting: Podiatry

## 2014-04-23 VITALS — BP 152/87 | HR 94 | Resp 16 | Wt 250.0 lb

## 2014-04-23 DIAGNOSIS — M21619 Bunion of unspecified foot: Secondary | ICD-10-CM

## 2014-04-23 DIAGNOSIS — M7750 Other enthesopathy of unspecified foot: Secondary | ICD-10-CM

## 2014-04-23 DIAGNOSIS — M21622 Bunionette of left foot: Secondary | ICD-10-CM

## 2014-04-23 DIAGNOSIS — M7752 Other enthesopathy of left foot: Secondary | ICD-10-CM

## 2014-04-23 NOTE — Progress Notes (Signed)
He presents today complaining of painful fifth metatarsal phalangeal joint pain.  Objective: Pulses are palpable left. He has pain on palpation fifth metatarsal head of the left foot. Taylor's bunion deformity is prevalent.  Assessment: Pain in limb secondary capsulitis and Taylor's bunion deformity with bursitis left foot.  Plan: Injected the bursa today with Kenalog and local anesthetic discussed surgical intervention.

## 2015-03-24 ENCOUNTER — Ambulatory Visit (INDEPENDENT_AMBULATORY_CARE_PROVIDER_SITE_OTHER): Payer: BLUE CROSS/BLUE SHIELD | Admitting: Podiatry

## 2015-03-24 ENCOUNTER — Encounter: Payer: Self-pay | Admitting: Podiatry

## 2015-03-24 VITALS — BP 153/77 | HR 98 | Resp 16

## 2015-03-24 DIAGNOSIS — M205X2 Other deformities of toe(s) (acquired), left foot: Secondary | ICD-10-CM | POA: Diagnosis not present

## 2015-03-24 DIAGNOSIS — M7752 Other enthesopathy of left foot: Secondary | ICD-10-CM | POA: Diagnosis not present

## 2015-03-24 DIAGNOSIS — M71572 Other bursitis, not elsewhere classified, left ankle and foot: Secondary | ICD-10-CM

## 2015-03-24 DIAGNOSIS — M779 Enthesopathy, unspecified: Secondary | ICD-10-CM

## 2015-03-24 DIAGNOSIS — Q828 Other specified congenital malformations of skin: Secondary | ICD-10-CM

## 2015-03-24 DIAGNOSIS — M21622 Bunionette of left foot: Secondary | ICD-10-CM

## 2015-03-24 DIAGNOSIS — M778 Other enthesopathies, not elsewhere classified: Secondary | ICD-10-CM

## 2015-03-24 NOTE — Progress Notes (Signed)
He presents today complaining of severe pain fifth metatarsophalangeal joint of the left foot. He is expressing consideration for surgical intervention however at this point he would like to wait until the fall. He would like another injection if possible.  Objective: Vital signs are stable he is alert and 3 denies changes in his past medical history medications allergies surgeries and social history. Pulses remain palpable left foot. He has pain on range of motion and palpation of the tailor's bunion deformity fifth metatarsophalangeal joint of the left foot. Multiple porokeratosis are also noted. These are extremely painful.  Assessment: Tailor's bunion deformity with capsulitis fifth metatarsophalangeal joint left. Porokeratosis fifth left.  Plan: We discussed the etiology pathology conservative versus surgical therapies today I suggested that he give [place] at least 2 weeks notice prior to scheduling surgery and we would also request at least 2 weeks postop rest. I injected peri-articularly about the fifth metatarsophalangeal joint today with Kenalog and local anesthesia. I debrided all reactive hyperkeratoses. He left pain-free.

## 2015-05-20 ENCOUNTER — Other Ambulatory Visit: Payer: Self-pay | Admitting: Neurosurgery

## 2015-05-20 DIAGNOSIS — M4727 Other spondylosis with radiculopathy, lumbosacral region: Secondary | ICD-10-CM

## 2015-05-31 ENCOUNTER — Ambulatory Visit
Admission: RE | Admit: 2015-05-31 | Discharge: 2015-05-31 | Disposition: A | Discharge status: Home / Self Care | Payer: BLUE CROSS/BLUE SHIELD | Source: Ambulatory Visit | Attending: Neurosurgery | Admitting: Neurosurgery

## 2015-05-31 DIAGNOSIS — M2578 Osteophyte, vertebrae: Secondary | ICD-10-CM | POA: Diagnosis not present

## 2015-05-31 DIAGNOSIS — M4806 Spinal stenosis, lumbar region: Secondary | ICD-10-CM | POA: Insufficient documentation

## 2015-05-31 DIAGNOSIS — M5126 Other intervertebral disc displacement, lumbar region: Secondary | ICD-10-CM | POA: Diagnosis not present

## 2015-05-31 DIAGNOSIS — M4727 Other spondylosis with radiculopathy, lumbosacral region: Secondary | ICD-10-CM | POA: Diagnosis present

## 2015-10-01 ENCOUNTER — Ambulatory Visit (INDEPENDENT_AMBULATORY_CARE_PROVIDER_SITE_OTHER): Payer: BLUE CROSS/BLUE SHIELD | Admitting: Family Medicine

## 2015-10-01 ENCOUNTER — Encounter: Payer: Self-pay | Admitting: Family Medicine

## 2015-10-01 VITALS — BP 128/82 | HR 106 | Temp 98.1°F | Ht 70.0 in | Wt 254.2 lb

## 2015-10-01 DIAGNOSIS — E669 Obesity, unspecified: Secondary | ICD-10-CM

## 2015-10-01 DIAGNOSIS — I2510 Atherosclerotic heart disease of native coronary artery without angina pectoris: Secondary | ICD-10-CM

## 2015-10-01 DIAGNOSIS — Z130 Encounter for screening for diseases of the blood and blood-forming organs and certain disorders involving the immune mechanism: Secondary | ICD-10-CM

## 2015-10-01 DIAGNOSIS — Z8639 Personal history of other endocrine, nutritional and metabolic disease: Secondary | ICD-10-CM | POA: Diagnosis not present

## 2015-10-01 DIAGNOSIS — M25551 Pain in right hip: Secondary | ICD-10-CM | POA: Diagnosis not present

## 2015-10-01 DIAGNOSIS — I10 Essential (primary) hypertension: Secondary | ICD-10-CM

## 2015-10-01 DIAGNOSIS — E785 Hyperlipidemia, unspecified: Secondary | ICD-10-CM | POA: Diagnosis not present

## 2015-10-01 DIAGNOSIS — F1010 Alcohol abuse, uncomplicated: Secondary | ICD-10-CM

## 2015-10-01 LAB — COMPREHENSIVE METABOLIC PANEL
ALT: 35 U/L (ref 0–53)
AST: 25 U/L (ref 0–37)
Albumin: 4.6 g/dL (ref 3.5–5.2)
Alkaline Phosphatase: 65 U/L (ref 39–117)
BUN: 18 mg/dL (ref 6–23)
CO2: 28 mEq/L (ref 19–32)
Calcium: 9.9 mg/dL (ref 8.4–10.5)
Chloride: 99 mEq/L (ref 96–112)
Creatinine, Ser: 0.9 mg/dL (ref 0.40–1.50)
GFR: 92.95 mL/min (ref >60.00)
Glucose, Bld: 109 mg/dL — ABNORMAL HIGH (ref 70–99)
Potassium: 4.5 mEq/L (ref 3.5–5.1)
Sodium: 135 mEq/L (ref 135–145)
Total Bilirubin: 0.5 mg/dL (ref 0.2–1.2)
Total Protein: 7.7 g/dL (ref 6.0–8.3)

## 2015-10-01 LAB — CBC
HCT: 47.4 % (ref 39.0–52.0)
Hemoglobin: 15.9 g/dL (ref 13.0–17.0)
MCHC: 33.5 g/dL (ref 30.0–36.0)
MCV: 100.5 fl — ABNORMAL HIGH (ref 78.0–100.0)
Platelets: 327 10*3/uL (ref 150.0–400.0)
RBC: 4.71 Mil/uL (ref 4.22–5.81)
RDW: 13.3 % (ref 11.5–15.5)
WBC: 9.2 10*3/uL (ref 4.0–10.5)

## 2015-10-01 LAB — LIPID PANEL
Cholesterol: 255 mg/dL — ABNORMAL HIGH (ref 0–200)
HDL: 43.8 mg/dL (ref >39.00)
NonHDL: 211.52
Total CHOL/HDL Ratio: 6
Triglycerides: 261 mg/dL — ABNORMAL HIGH (ref 0.0–149.0)
VLDL: 52.2 mg/dL — ABNORMAL HIGH (ref 0.0–40.0)

## 2015-10-01 LAB — TSH: TSH: 2.26 u[IU]/mL (ref 0.35–4.50)

## 2015-10-01 LAB — HEMOGLOBIN A1C: Hgb A1c MFr Bld: 5.5 % (ref 4.6–6.5)

## 2015-10-01 LAB — LDL CHOLESTEROL, DIRECT: Direct LDL: 175 mg/dL

## 2015-10-01 NOTE — Patient Instructions (Signed)
It was nice to see you today.  We will schedule your appt to see cardiology.  Follow up will be scheduled after cardiology appt (roughly 3-6 months).  Take care  Dr. Lacinda Axon

## 2015-10-01 NOTE — Progress Notes (Signed)
Pre visit review using our clinic review tool, if applicable. No additional management support is needed unless otherwise documented below in the visit note. 

## 2015-10-03 DIAGNOSIS — F1010 Alcohol abuse, uncomplicated: Secondary | ICD-10-CM | POA: Insufficient documentation

## 2015-10-03 DIAGNOSIS — F1020 Alcohol dependence, uncomplicated: Secondary | ICD-10-CM | POA: Insufficient documentation

## 2015-10-03 DIAGNOSIS — M25551 Pain in right hip: Secondary | ICD-10-CM | POA: Insufficient documentation

## 2015-10-03 MED ORDER — LISINOPRIL-HYDROCHLOROTHIAZIDE 20-12.5 MG PO TABS: 1.0000 | ORAL_TABLET | Freq: Every day | ORAL | Status: DC

## 2015-10-03 NOTE — Assessment & Plan Note (Signed)
Well controlled. Continue Lisinopril/HCTZ. 

## 2015-10-03 NOTE — Assessment & Plan Note (Signed)
Uncontrolled. LDL 175. ASCVD risk 9.6%. Will discuss statin therapy with patient.
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
## 2015-10-03 NOTE — Assessment & Plan Note (Signed)
Needs to see cardiology for surgical clearance given known CAD. Continue Aspirin and ACEI. Would likely benefit from beta blocker. Defer to cardiology. Would recommend low dose coreg. Needs statin as well.

## 2015-10-03 NOTE — Assessment & Plan Note (Signed)
Alcoholic. Encouraged patient to cut back slowly and stop. Patient not interested in cutting back.

## 2015-10-03 NOTE — Assessment & Plan Note (Signed)
Sending to cards for surgical clearance. Needs to avoid NSAID's given history.

## 2015-10-11 ENCOUNTER — Ambulatory Visit (INDEPENDENT_AMBULATORY_CARE_PROVIDER_SITE_OTHER): Payer: BLUE CROSS/BLUE SHIELD | Admitting: Cardiovascular Disease

## 2015-10-11 ENCOUNTER — Encounter: Payer: Self-pay | Admitting: Cardiovascular Disease

## 2015-10-11 VITALS — BP 110/70 | HR 96 | Ht 70.0 in | Wt 253.0 lb

## 2015-10-11 DIAGNOSIS — I10 Essential (primary) hypertension: Secondary | ICD-10-CM | POA: Diagnosis not present

## 2015-10-11 DIAGNOSIS — R0602 Shortness of breath: Secondary | ICD-10-CM

## 2015-10-11 DIAGNOSIS — Z01810 Encounter for preprocedural cardiovascular examination: Secondary | ICD-10-CM | POA: Diagnosis not present

## 2015-10-11 DIAGNOSIS — I2510 Atherosclerotic heart disease of native coronary artery without angina pectoris: Secondary | ICD-10-CM

## 2015-10-11 DIAGNOSIS — E785 Hyperlipidemia, unspecified: Secondary | ICD-10-CM

## 2015-10-11 MED ORDER — PRAVASTATIN SODIUM 40 MG PO TABS: 40.0000 mg | ORAL_TABLET | Freq: Every day | ORAL | Status: DC

## 2015-10-11 NOTE — Patient Instructions (Addendum)
Medication Instructions:  Your physician has recommended you make the following change in your medication:  START taking pravastatin 40mg  daily   Labwork: Lipid and liver profile in 6 weeks. Nothing to eat or drink after midnight the evening before your labs  Testing/Procedures: Your physician has requested that you have a lexiscan myoview. For further information please visit [email or web address]. Please follow instruction sheet, as given.  [place]  Your caregiver has ordered a Stress Test with nuclear imaging. The purpose of this test is to evaluate the blood supply to your heart muscle. This procedure is referred to as a "Non-Invasive Stress Test." This is because other than having an IV started in your vein, nothing is inserted or "invades" your body. Cardiac stress tests are done to find areas of poor blood flow to the heart by determining the extent of coronary artery disease (CAD). Some patients exercise on a treadmill, which naturally increases the blood flow to your heart, while others who are  unable to walk on a treadmill due to physical limitations have a pharmacologic/chemical stress agent called Lexiscan . This medicine will mimic walking on a treadmill by temporarily increasing your coronary blood flow.   Please note: these test may take anywhere between 2-4 hours to complete  PLEASE REPORT TO [place] AT THE FIRST DESK WILL DIRECT YOU WHERE TO GO  Date of Procedure: Feb 21  Arrival Time for Procedure: 7:45am  Instructions regarding medication: You may take your morning medication with a sip of water.    PLEASE NOTIFY THE OFFICE AT LEAST 46 HOURS IN ADVANCE IF YOU ARE UNABLE TO KEEP YOUR APPOINTMENT.  [phone] AND  PLEASE NOTIFY NUCLEAR MEDICINE AT [facility] AT LEAST 24 HOURS IN ADVANCE IF YOU ARE UNABLE TO KEEP YOUR APPOINTMENT. [phone]  How to prepare for your Myoview test:   Do not eat or drink after midnight  No caffeine  for 24 hours prior to test  No smoking 24 hours prior to test.  Your medication may be taken with water.  If your doctor stopped a medication because of this test, do not take that medication.  Ladies, please do not wear dresses.  Skirts or pants are appropriate. Please wear a short sleeve shirt.  No perfume, cologne or lotion.  Wear comfortable walking shoes. No heels!            Follow-Up: Your physician wants you to follow-up in: six months with Dr. Fletcher Anon.  You will receive a reminder letter in the mail two months in advance. If you don't receive a letter, please call our office to schedule the follow-up appointment.   Any Other Special Instructions Will Be Listed Below (If Applicable).     If you need a refill on your cardiac medications before your next appointment, please call your pharmacy.  Cardiac Nuclear Scanning A cardiac nuclear scan is used to check your heart for problems, such as the following:  A portion of the heart is not getting enough blood.  Part of the heart muscle has died, which happens with a heart attack.  The heart wall is not working normally.  In this test, a radioactive dye (tracer) is injected into your bloodstream. After the tracer has traveled to your heart, a scanning device is used to measure how much of the tracer is absorbed by or distributed to various areas of your heart. LET [facility] CARE PROVIDER KNOW ABOUT:  Any allergies you have.  All medicines you  are taking, including vitamins, herbs, eye drops, creams, and over-the-counter medicines.  Previous problems you or members of your family have had with the use of anesthetics.  Any blood disorders you have.  Previous surgeries you have had.  Medical conditions you have.  RISKS AND COMPLICATIONS Generally, this is a safe procedure. However, as with any procedure, problems can occur. Possible problems include:   Serious chest pain.  Rapid heartbeat.  Sensation of  warmth in your chest. This usually passes quickly. BEFORE THE PROCEDURE Ask your health care provider about changing or stopping your regular medicines. PROCEDURE This procedure is usually done at a hospital and takes 2-4 hours.  An IV tube is inserted into one of your veins.  Your health care provider will inject a small amount of radioactive tracer through the tube.  You will then wait for 20-40 minutes while the tracer travels through your bloodstream.  You will lie down on an exam table so images of your heart can be taken. Images will be taken for about 15-20 minutes.  You will exercise on a treadmill or stationary bike. While you exercise, your heart activity will be monitored with an electrocardiogram (ECG), and your blood pressure will be checked.  If you are unable to exercise, you may be given a medicine to make your heart beat faster.  When blood flow to your heart has peaked, tracer will again be injected through the IV tube.  After 20-40 minutes, you will get back on the exam table and have more images taken of your heart.  When the procedure is over, your IV tube will be removed. AFTER THE PROCEDURE  You will likely be able to leave shortly after the test. Unless your health care provider tells you otherwise, you may return to your normal schedule, including diet, activities, and medicines.  Make sure you find out how and when you will get your test results.   This information is not intended to replace advice given to you by your health care provider. Make sure you discuss any questions you have with your health care provider.   Document Released: 09/08/2004 Document Revised: 08/19/2013 Document Reviewed: 07/23/2013 Elsevier Interactive Patient Education [employer].

## 2015-10-12 DIAGNOSIS — Z01810 Encounter for preprocedural cardiovascular examination: Secondary | ICD-10-CM | POA: Insufficient documentation

## 2015-10-12 NOTE — Assessment & Plan Note (Signed)
Lab Results  Component Value Date   CHOL 255* 10/01/2015   HDL 43.80 10/01/2015   LDLCALC * 03/07/2009    220        Total Cholesterol/HDL:CHD Risk Coronary Heart Disease Risk Table                     Men   Women  1/2 Average Risk   3.4   3.3  Average Risk       5.0   4.4  2 X Average Risk   9.6   7.1  3 X Average Risk  23.4   11.0        Use the calculated Patient Ratio above and the CHD Risk Table to determine the patient's CHD Risk.        ATP III CLASSIFICATION (LDL):  <100     mg/dL   Optimal  100-129  mg/dL   Near or Above                    Optimal  130-159  mg/dL   Borderline  160-189  mg/dL   High  >190     mg/dL   Very High   LDLDIRECT 175.0 10/01/2015   TRIG 261.0* 10/01/2015   CHOLHDL 6 10/01/2015   His recent LDL was very elevated. I recommend a target LDL of less than 100 and preferably less than 70. I discussed this with him. He did not tolerate atorvastatin in the past. Thus, I started him on pravastatin 40 mg once daily. We will need a follow-up lipid and liver profile in 6 weeks.
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
## 2015-10-12 NOTE — Assessment & Plan Note (Signed)
Will evaluate with a stress test as outlined above. Will need to control his risk factors.

## 2015-10-12 NOTE — Assessment & Plan Note (Signed)
The patient has known history of mild coronary artery disease on previous cardiac catheterization in 2011 with moderate calcium score on CTA. He is known to have hyperlipidemia and has not been on treatment recently. His current symptoms include exertional dyspnea and his physical capacity has been very reduced lately due to left hip pain. Due to that, I recommend ischemic cardiac evaluation with a pharmacologic nuclear stress test before surgery. He is not able to exercise on a treadmill.

## 2015-10-12 NOTE — Assessment & Plan Note (Signed)
Blood pressure is controlled on current medications. 
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
## 2015-10-18 ENCOUNTER — Telehealth: Payer: Self-pay

## 2015-10-18 NOTE — Telephone Encounter (Signed)
Left detailed message reviewing myoview instructions on pt VM. Left CB number if questions.

## 2015-10-19 ENCOUNTER — Encounter
Admission: RE | Admit: 2015-10-19 | Discharge: 2015-10-19 | Disposition: A | Discharge status: Home / Self Care | Payer: BLUE CROSS/BLUE SHIELD | Source: Ambulatory Visit | Attending: Cardiovascular Disease | Admitting: Cardiovascular Disease

## 2015-10-19 DIAGNOSIS — R0602 Shortness of breath: Secondary | ICD-10-CM | POA: Insufficient documentation

## 2015-10-19 MED ORDER — REGADENOSON 0.4 MG/5ML IV SOLN
0.4000 mg | Freq: Once | INTRAVENOUS | Status: AC
  Administered 2015-10-19: 0.4 mg via INTRAVENOUS

## 2015-10-19 MED ORDER — TECHNETIUM TC 99M SESTAMIBI - CARDIOLITE
14.0690 | Freq: Once | INTRAVENOUS | Status: AC | PRN
  Administered 2015-10-19: 08:00:00 14.069 via INTRAVENOUS

## 2015-10-19 MED ORDER — TECHNETIUM TC 99M SESTAMIBI - CARDIOLITE
30.1600 | Freq: Once | INTRAVENOUS | Status: AC | PRN
  Administered 2015-10-19: 30.16 via INTRAVENOUS

## 2015-10-19 NOTE — Patient Instructions (Addendum)
Samuel Heath  10/19/2015   Your procedure is scheduled on: 10-26-15  Report to [facility] Main  Entrance take [facility]  elevators to 3rd floor to  [place] at  [place] AM.  Call this number if you have problems the morning of surgery [phone]   Remember: ONLY 1 PERSON MAY GO WITH YOU TO SHORT STAY TO GET  READY MORNING OF [place].  Do not eat food or drink liquids :After Midnight.     Take these medicines the morning of surgery with A SIP OF WATER: Omeprazole. Pravastatin. DO NOT TAKE ANY DIABETIC MEDICATIONS DAY OF YOUR SURGERY                               You may not have any metal on your body including hair pins and              piercings  Do not wear jewelry, make-up, lotions, powders or perfumes, deodorant             Do not wear nail polish.  Do not shave  48 hours prior to surgery.              Men may shave face and neck.   Do not bring valuables to the hospital. [place].  Contacts, dentures or bridgework may not be worn into surgery.  Leave suitcase in the car. After surgery it may be brought to your room.     Patients discharged the day of surgery will not be allowed to drive home.  Name and phone number of your driver: Samuel Heath [phone]  Special Instructions: N/A              Please read over the following fact sheets you were given: _____________________________________________________________________             [facility] - Preparing for Surgery Before surgery, you can play an important role.  Because skin is not sterile, your skin needs to be as free of germs as possible.  You can reduce the number of germs on your skin by washing with CHG (chlorahexidine gluconate) soap before surgery.  CHG is an antiseptic cleaner which kills germs and bonds with the skin to continue killing germs even after washing. Please DO NOT use if you have an allergy to CHG or antibacterial  soaps.  If your skin becomes reddened/irritated stop using the CHG and inform your nurse when you arrive at Short Stay. Do not shave (including legs and underarms) for at least 48 hours prior to the first CHG shower.  You may shave your face/neck. Please follow these instructions carefully:  1.  Shower with CHG Soap the night before surgery and the  morning of Surgery.  2.  If you choose to wash your hair, wash your hair first as usual with your  normal  shampoo.  3.  After you shampoo, rinse your hair and body thoroughly to remove the  shampoo.                           4.  Use CHG as you would any other liquid soap.  You can apply chg directly  to the skin  and wash                       Gently with a scrungie or clean washcloth.  5.  Apply the CHG Soap to your body ONLY FROM THE NECK DOWN.   Do not use on face/ open                           Wound or open sores. Avoid contact with eyes, ears mouth and genitals (private parts).                       Wash face,  Genitals (private parts) with your normal soap.             6.  Wash thoroughly, paying special attention to the area where your surgery  will be performed.  7.  Thoroughly rinse your body with warm water from the neck down.  8.  DO NOT shower/wash with your normal soap after using and rinsing off  the CHG Soap.                9.  Pat yourself dry with a clean towel.            10.  Wear clean pajamas.            11.  Place clean sheets on your bed the night of your first shower and do not  sleep with pets. Day of Surgery : Do not apply any lotions/deodorants the morning of surgery.  Please wear clean clothes to the hospital/surgery center.  FAILURE TO FOLLOW THESE INSTRUCTIONS MAY RESULT IN THE CANCELLATION OF YOUR SURGERY PATIENT SIGNATURE_________________________________  NURSE SIGNATURE__________________________________  ________________________________________________________________________   Samuel Heath  An  incentive spirometer is a tool that can help keep your lungs clear and active. This tool measures how well you are filling your lungs with each breath. Taking long deep breaths may help reverse or decrease the chance of developing breathing (pulmonary) problems (especially infection) following:  A long period of time when you are unable to move or be active. BEFORE THE PROCEDURE   If the spirometer includes an indicator to show your best effort, your nurse or respiratory therapist will set it to a desired goal.  If possible, sit up straight or lean slightly forward. Try not to slouch.  Hold the incentive spirometer in an upright position. INSTRUCTIONS FOR USE   Sit on the edge of your bed if possible, or sit up as far as you can in bed or on a chair.  Hold the incentive spirometer in an upright position.  Breathe out normally.  Place the mouthpiece in your mouth and seal your lips tightly around it.  Breathe in slowly and as deeply as possible, raising the piston or the ball toward the top of the column.  Hold your breath for 3-5 seconds or for as long as possible. Allow the piston or ball to fall to the bottom of the column.  Remove the mouthpiece from your mouth and breathe out normally.  Rest for a few seconds and repeat Steps 1 through 7 at least 10 times every 1-2 hours when you are awake. Take your time and take a few normal breaths between deep breaths.  The spirometer may include an indicator to show your best effort. Use the indicator as a goal to work toward during each repetition.  After each set of 10  deep breaths, practice coughing to be sure your lungs are clear. If you have an incision (the cut made at the time of surgery), support your incision when coughing by placing a pillow or rolled up towels firmly against it. Once you are able to get out of bed, walk around indoors and cough well. You may stop using the incentive spirometer when instructed by your caregiver.   RISKS AND COMPLICATIONS  Take your time so you do not get dizzy or light-headed.  If you are in pain, you may need to take or ask for pain medication before doing incentive spirometry. It is harder to take a deep breath if you are having pain. AFTER USE  Rest and breathe slowly and easily.  It can be helpful to keep track of a log of your progress. Your caregiver can provide you with a simple table to help with this. If you are using the spirometer at home, follow these instructions: [place] IF:   You are having difficultly using the spirometer.  You have trouble using the spirometer as often as instructed.  Your pain medication is not giving enough relief while using the spirometer.  You develop fever of 100.5 F (38.1 C) or higher. SEEK IMMEDIATE MEDICAL CARE IF:   You cough up bloody sputum that had not been present before.  You develop fever of 102 F (38.9 C) or greater.  You develop worsening pain at or near the incision site. MAKE SURE YOU:   Understand these instructions.  Will watch your condition.  Will get help right away if you are not doing well or get worse. Document Released: 12/25/2006 Document Revised: 11/06/2011 Document Reviewed: 02/25/2007 ExitCare Patient Information 2014 ExitCare, [place].   ________________________________________________________________________  WHAT IS A BLOOD TRANSFUSION? Blood Transfusion Information  A transfusion is the replacement of blood or some of its parts. Blood is made up of multiple cells which provide different functions.  Red blood cells carry oxygen and are used for blood loss replacement.  White blood cells fight against infection.  Platelets control bleeding.  Plasma helps clot blood.  Other blood products are available for specialized needs, such as hemophilia or other clotting disorders. BEFORE THE TRANSFUSION  Who gives blood for transfusions?   Healthy volunteers who are fully evaluated  to make sure their blood is safe. This is blood bank blood. Transfusion therapy is the safest it has ever been in the practice of medicine. Before blood is taken from a donor, a complete history is taken to make sure that person has no history of diseases nor engages in risky social behavior (examples are intravenous drug use or sexual activity with multiple partners). The donor's travel history is screened to minimize risk of transmitting infections, such as malaria. The donated blood is tested for signs of infectious diseases, such as HIV and hepatitis. The blood is then tested to be sure it is compatible with you in order to minimize the chance of a transfusion reaction. If you or a relative donates blood, this is often done in anticipation of surgery and is not appropriate for emergency situations. It takes many days to process the donated blood. RISKS AND COMPLICATIONS Although transfusion therapy is very safe and saves many lives, the main dangers of transfusion include:   Getting an infectious disease.  Developing a transfusion reaction. This is an allergic reaction to something in the blood you were given. Every precaution is taken to prevent this. The decision to have a blood transfusion  has been considered carefully by your caregiver before blood is given. Blood is not given unless the benefits outweigh the risks. AFTER THE TRANSFUSION  Right after receiving a blood transfusion, you will usually feel much better and more energetic. This is especially true if your red blood cells have gotten low (anemic). The transfusion raises the level of the red blood cells which carry oxygen, and this usually causes an energy increase.  The nurse administering the transfusion will monitor you carefully for complications. HOME CARE INSTRUCTIONS  No special instructions are needed after a transfusion. You may find your energy is better. Speak with your caregiver about any limitations on activity for  underlying diseases you may have. SEEK MEDICAL CARE IF:   Your condition is not improving after your transfusion.  You develop redness or irritation at the intravenous (IV) site. SEEK IMMEDIATE MEDICAL CARE IF:  Any of the following symptoms occur over the next 12 hours:  Shaking chills.  You have a temperature by mouth above 102 F (38.9 C), not controlled by medicine.  Chest, back, or muscle pain.  People around you feel you are not acting correctly or are confused.  Shortness of breath or difficulty breathing.  Dizziness and fainting.  You get a rash or develop hives.  You have a decrease in urine output.  Your urine turns a dark color or changes to pink, red, or brown. Any of the following symptoms occur over the next 10 days:  You have a temperature by mouth above 102 F (38.9 C), not controlled by medicine.  Shortness of breath.  Weakness after normal activity.  The white part of the eye turns yellow (jaundice).  You have a decrease in the amount of urine or are urinating less often.  Your urine turns a dark color or changes to pink, red, or brown. Document Released: 08/11/2000 Document Revised: 11/06/2011 Document Reviewed: 03/30/2008 [facility] Patient Information 2014 [place], [place].  _______________________________________________________________________

## 2015-10-20 ENCOUNTER — Encounter (HOSPITAL_COMMUNITY): Payer: Self-pay

## 2015-10-20 ENCOUNTER — Encounter (HOSPITAL_COMMUNITY)
Admission: RE | Admit: 2015-10-20 | Discharge: 2015-10-20 | Disposition: A | Discharge status: Home / Self Care | Payer: BLUE CROSS/BLUE SHIELD | Source: Ambulatory Visit | Attending: Orthopedic Surgery | Admitting: Orthopedic Surgery

## 2015-10-20 DIAGNOSIS — Z0183 Encounter for blood typing: Secondary | ICD-10-CM | POA: Diagnosis not present

## 2015-10-20 DIAGNOSIS — M879 Osteonecrosis, unspecified: Secondary | ICD-10-CM | POA: Insufficient documentation

## 2015-10-20 DIAGNOSIS — Z01812 Encounter for preprocedural laboratory examination: Secondary | ICD-10-CM | POA: Insufficient documentation

## 2015-10-20 LAB — NM MYOCAR MULTI W/SPECT W/WALL MOTION / EF
Estimated workload: 1 METS
Exercise duration (min): 0 min
Exercise duration (sec): 0 s
LV dias vol: 94 mL
LV sys vol: 27 mL
MPHR: 165 {beats}/min
Peak HR: 118 {beats}/min
Percent HR: 71 %
Percent of predicted max HR: 71 %
Rest HR: 91 {beats}/min
SDS: 0
SRS: 1
SSS: 0
Stage 1 Grade: 0 %
Stage 1 HR: 88 {beats}/min
Stage 1 Speed: 0 mph
Stage 2 Grade: 0 %
Stage 2 HR: 88 {beats}/min
Stage 2 Speed: 0 mph
Stage 3 Grade: 0 %
Stage 3 HR: 118 {beats}/min
Stage 3 Speed: 0 mph
Stage 4 DBP: 88 mmHg
Stage 4 Grade: 0 %
Stage 4 HR: 96 {beats}/min
Stage 4 SBP: 142 mmHg
Stage 4 Speed: 0 mph
TID: 1.19

## 2015-10-20 LAB — URINALYSIS, ROUTINE W REFLEX MICROSCOPIC
Bilirubin Urine: NEGATIVE
Glucose, UA: NEGATIVE mg/dL
Hgb urine dipstick: NEGATIVE
Ketones, ur: NEGATIVE mg/dL
Leukocytes, UA: NEGATIVE
Nitrite: NEGATIVE
Protein, ur: NEGATIVE mg/dL
Specific Gravity, Urine: 1.019 (ref 1.005–1.030)
pH: 5.5 (ref 5.0–8.0)

## 2015-10-20 LAB — SURGICAL PCR SCREEN
MRSA, PCR: NEGATIVE
Staphylococcus aureus: POSITIVE — AB

## 2015-10-20 LAB — PROTIME-INR
INR: 1.01 (ref 0.00–1.49)
Prothrombin Time: 13.1 seconds (ref 11.6–15.2)

## 2015-10-20 LAB — ABO/RH: ABO/RH(D): A POS

## 2015-10-20 LAB — APTT: aPTT: 29 seconds (ref 24–37)

## 2015-10-20 NOTE — Pre-Procedure Instructions (Addendum)
10-01-15 labs- CBC, CMP, Hgb A1C, TSH in Epic. Stress, EKG 2'17 - Epic. 10-20-15 Faxed note to Dr. Lacinda Axon, PCP- pt screen for Stop/Bang=5, with PAT visit.

## 2015-10-20 NOTE — Progress Notes (Signed)
Your Pt has screened with an elevated risk for obstructive sleep apnea using the Stop-Bang tool during a presurgical  Visit. 

## 2015-10-21 NOTE — Progress Notes (Signed)
10-21-15 1515 Positive for Staph aureus by PCR- pt. Aware to use Mupirocin Ointment as directed. Fax note sent to Dr. Aurea Graff office [phone].

## 2015-10-25 ENCOUNTER — Telehealth: Payer: Self-pay

## 2015-10-25 NOTE — Telephone Encounter (Signed)
Clearance for left total hip surgery faxed to [facility], [phone]

## 2015-10-26 ENCOUNTER — Encounter (HOSPITAL_COMMUNITY)
Admission: RE | Disposition: A | Discharge status: Home / Self Care | Payer: Self-pay | Source: Ambulatory Visit | Attending: Orthopedic Surgery

## 2015-10-26 ENCOUNTER — Inpatient Hospital Stay (HOSPITAL_COMMUNITY): Payer: BLUE CROSS/BLUE SHIELD | Admitting: Anesthesiology

## 2015-10-26 ENCOUNTER — Inpatient Hospital Stay (HOSPITAL_COMMUNITY): Payer: BLUE CROSS/BLUE SHIELD

## 2015-10-26 ENCOUNTER — Inpatient Hospital Stay (HOSPITAL_COMMUNITY)
Admission: RE | Admit: 2015-10-26 | Discharge: 2015-10-26 | DRG: 470 | Disposition: A | Discharge status: Home / Self Care | Payer: BLUE CROSS/BLUE SHIELD | Source: Ambulatory Visit | Attending: Orthopedic Surgery | Admitting: Orthopedic Surgery

## 2015-10-26 ENCOUNTER — Encounter (HOSPITAL_COMMUNITY): Payer: Self-pay | Admitting: *Deleted

## 2015-10-26 DIAGNOSIS — K219 Gastro-esophageal reflux disease without esophagitis: Secondary | ICD-10-CM | POA: Diagnosis present

## 2015-10-26 DIAGNOSIS — I2510 Atherosclerotic heart disease of native coronary artery without angina pectoris: Secondary | ICD-10-CM | POA: Diagnosis present

## 2015-10-26 DIAGNOSIS — F1010 Alcohol abuse, uncomplicated: Secondary | ICD-10-CM | POA: Diagnosis present

## 2015-10-26 DIAGNOSIS — E039 Hypothyroidism, unspecified: Secondary | ICD-10-CM | POA: Diagnosis present

## 2015-10-26 DIAGNOSIS — I10 Essential (primary) hypertension: Secondary | ICD-10-CM | POA: Diagnosis present

## 2015-10-26 DIAGNOSIS — Z811 Family history of alcohol abuse and dependence: Secondary | ICD-10-CM

## 2015-10-26 DIAGNOSIS — N529 Male erectile dysfunction, unspecified: Secondary | ICD-10-CM | POA: Diagnosis present

## 2015-10-26 DIAGNOSIS — M1612 Unilateral primary osteoarthritis, left hip: Secondary | ICD-10-CM | POA: Diagnosis present

## 2015-10-26 DIAGNOSIS — Z8249 Family history of ischemic heart disease and other diseases of the circulatory system: Secondary | ICD-10-CM

## 2015-10-26 DIAGNOSIS — E785 Hyperlipidemia, unspecified: Secondary | ICD-10-CM | POA: Diagnosis present

## 2015-10-26 DIAGNOSIS — M25552 Pain in left hip: Secondary | ICD-10-CM | POA: Diagnosis present

## 2015-10-26 DIAGNOSIS — M87852 Other osteonecrosis, left femur: Secondary | ICD-10-CM | POA: Diagnosis present

## 2015-10-26 DIAGNOSIS — Z96649 Presence of unspecified artificial hip joint: Secondary | ICD-10-CM

## 2015-10-26 DIAGNOSIS — Z01812 Encounter for preprocedural laboratory examination: Secondary | ICD-10-CM | POA: Diagnosis not present

## 2015-10-26 DIAGNOSIS — Z87891 Personal history of nicotine dependence: Secondary | ICD-10-CM | POA: Diagnosis not present

## 2015-10-26 HISTORY — PX: TOTAL HIP ARTHROPLASTY: SHX124

## 2015-10-26 LAB — TYPE AND SCREEN
ABO/RH(D): A POS
Antibody Screen: NEGATIVE

## 2015-10-26 SURGERY — ARTHROPLASTY, HIP, TOTAL, ANTERIOR APPROACH
Anesthesia: General | Site: Hip | Laterality: Left

## 2015-10-26 MED ORDER — METHOCARBAMOL 500 MG PO TABS: 500.0000 mg | ORAL_TABLET | Freq: Four times a day (QID) | ORAL | Status: DC | PRN

## 2015-10-26 MED ORDER — LIDOCAINE HCL (CARDIAC) 20 MG/ML IV SOLN
INTRAVENOUS | Status: AC
  Filled 2015-10-26: qty 5

## 2015-10-26 MED ORDER — HYDROMORPHONE HCL 1 MG/ML IJ SOLN
INTRAMUSCULAR | Status: DC | PRN
  Administered 2015-10-26 (×3): 0.5 mg via INTRAVENOUS
  Administered 2015-10-26: 1 mg via INTRAVENOUS
  Administered 2015-10-26: 0.5 mg via INTRAVENOUS
  Administered 2015-10-26: 1 mg via INTRAVENOUS

## 2015-10-26 MED ORDER — HYDROMORPHONE HCL 1 MG/ML IJ SOLN: 0.5000 mg | INTRAMUSCULAR | Status: DC | PRN

## 2015-10-26 MED ORDER — TRANEXAMIC ACID 1000 MG/10ML IV SOLN
1000.0000 mg | Freq: Once | INTRAVENOUS | Status: AC
  Administered 2015-10-26: 1000 mg via INTRAVENOUS
  Filled 2015-10-26 (×3): qty 10

## 2015-10-26 MED ORDER — ONDANSETRON HCL 4 MG/2ML IJ SOLN
INTRAMUSCULAR | Status: AC
  Filled 2015-10-26: qty 2

## 2015-10-26 MED ORDER — ONDANSETRON HCL 4 MG/2ML IJ SOLN
INTRAMUSCULAR | Status: DC | PRN
  Administered 2015-10-26: 4 mg via INTRAVENOUS

## 2015-10-26 MED ORDER — CEPHALEXIN 500 MG PO CAPS: 500.0000 mg | ORAL_CAPSULE | Freq: Three times a day (TID) | ORAL | Status: DC

## 2015-10-26 MED ORDER — HYDROCODONE-ACETAMINOPHEN 7.5-325 MG PO TABS
ORAL_TABLET | ORAL | Status: AC
  Administered 2015-10-26: 1 via ORAL
  Filled 2015-10-26: qty 1

## 2015-10-26 MED ORDER — LACTATED RINGERS IV SOLN
INTRAVENOUS | Status: DC | PRN
Start: 2015-10-26 — End: 2015-10-26
  Administered 2015-10-26 (×2): via INTRAVENOUS

## 2015-10-26 MED ORDER — HYDRALAZINE HCL 20 MG/ML IJ SOLN
INTRAMUSCULAR | Status: DC | PRN
  Administered 2015-10-26: 5 mg via INTRAVENOUS

## 2015-10-26 MED ORDER — HYDROMORPHONE HCL 2 MG/ML IJ SOLN
INTRAMUSCULAR | Status: AC
  Filled 2015-10-26: qty 1

## 2015-10-26 MED ORDER — LABETALOL HCL 5 MG/ML IV SOLN
5.0000 mg | Freq: Once | INTRAVENOUS | Status: AC
  Administered 2015-10-26: 5 mg via INTRAVENOUS

## 2015-10-26 MED ORDER — FENTANYL CITRATE (PF) 100 MCG/2ML IJ SOLN
INTRAMUSCULAR | Status: AC
  Filled 2015-10-26: qty 2

## 2015-10-26 MED ORDER — DEXTROSE 5 % IV SOLN
500.0000 mg | Freq: Four times a day (QID) | INTRAVENOUS | Status: DC | PRN
  Administered 2015-10-26: 500 mg via INTRAVENOUS
  Filled 2015-10-26 (×2): qty 5

## 2015-10-26 MED ORDER — FENTANYL CITRATE (PF) 250 MCG/5ML IJ SOLN
INTRAMUSCULAR | Status: AC
  Filled 2015-10-26: qty 5

## 2015-10-26 MED ORDER — ROCURONIUM BROMIDE 100 MG/10ML IV SOLN
INTRAVENOUS | Status: AC
  Filled 2015-10-26: qty 1

## 2015-10-26 MED ORDER — SODIUM CHLORIDE 0.9 % IR SOLN
Status: DC | PRN
  Administered 2015-10-26: 1000 mL

## 2015-10-26 MED ORDER — FENTANYL CITRATE (PF) 100 MCG/2ML IJ SOLN: 25.0000 ug | INTRAMUSCULAR | Status: DC | PRN

## 2015-10-26 MED ORDER — HYDROCODONE-ACETAMINOPHEN 7.5-325 MG PO TABS: 1.0000 | ORAL_TABLET | ORAL | Status: DC | PRN

## 2015-10-26 MED ORDER — CEFAZOLIN SODIUM-DEXTROSE 2-3 GM-% IV SOLR
INTRAVENOUS | Status: AC
  Filled 2015-10-26: qty 50

## 2015-10-26 MED ORDER — ASPIRIN EC 325 MG PO TBEC: 325.0000 mg | DELAYED_RELEASE_TABLET | Freq: Two times a day (BID) | ORAL | Status: AC

## 2015-10-26 MED ORDER — MIDAZOLAM HCL 2 MG/2ML IJ SOLN
INTRAMUSCULAR | Status: AC
  Filled 2015-10-26: qty 2

## 2015-10-26 MED ORDER — MIDAZOLAM HCL 5 MG/5ML IJ SOLN
INTRAMUSCULAR | Status: DC | PRN
  Administered 2015-10-26 (×2): 2 mg via INTRAVENOUS

## 2015-10-26 MED ORDER — SUGAMMADEX SODIUM 200 MG/2ML IV SOLN
INTRAVENOUS | Status: DC | PRN
  Administered 2015-10-26: 200 mg via INTRAVENOUS

## 2015-10-26 MED ORDER — CEFAZOLIN SODIUM-DEXTROSE 2-3 GM-% IV SOLR
2.0000 g | INTRAVENOUS | Status: AC
  Administered 2015-10-26: 2 g via INTRAVENOUS

## 2015-10-26 MED ORDER — DEXAMETHASONE SODIUM PHOSPHATE 10 MG/ML IJ SOLN
INTRAMUSCULAR | Status: AC
  Filled 2015-10-26: qty 1

## 2015-10-26 MED ORDER — LIDOCAINE HCL (CARDIAC) 20 MG/ML IV SOLN
INTRAVENOUS | Status: DC | PRN
  Administered 2015-10-26: 50 mg via INTRAVENOUS

## 2015-10-26 MED ORDER — MEPERIDINE HCL 50 MG/ML IJ SOLN: 6.2500 mg | INTRAMUSCULAR | Status: DC | PRN

## 2015-10-26 MED ORDER — ROCURONIUM BROMIDE 100 MG/10ML IV SOLN
INTRAVENOUS | Status: DC | PRN
Start: 2015-10-26 — End: 2015-10-26
  Administered 2015-10-26: 10 mg via INTRAVENOUS
  Administered 2015-10-26: 40 mg via INTRAVENOUS
  Administered 2015-10-26: 30 mg via INTRAVENOUS
  Administered 2015-10-26: 20 mg via INTRAVENOUS

## 2015-10-26 MED ORDER — FENTANYL CITRATE (PF) 100 MCG/2ML IJ SOLN
INTRAMUSCULAR | Status: DC | PRN
  Administered 2015-10-26: 100 ug via INTRAVENOUS
  Administered 2015-10-26 (×5): 50 ug via INTRAVENOUS

## 2015-10-26 MED ORDER — PROPOFOL 10 MG/ML IV BOLUS
INTRAVENOUS | Status: AC
  Filled 2015-10-26: qty 20

## 2015-10-26 MED ORDER — SUGAMMADEX SODIUM 200 MG/2ML IV SOLN
INTRAVENOUS | Status: AC
  Filled 2015-10-26: qty 2

## 2015-10-26 MED ORDER — DEXAMETHASONE SODIUM PHOSPHATE 10 MG/ML IJ SOLN
10.0000 mg | Freq: Once | INTRAMUSCULAR | Status: AC
  Administered 2015-10-26: 10 mg via INTRAVENOUS

## 2015-10-26 MED ORDER — PROPOFOL 10 MG/ML IV BOLUS
INTRAVENOUS | Status: DC | PRN
Start: 2015-10-26 — End: 2015-10-26
  Administered 2015-10-26: 200 mg via INTRAVENOUS

## 2015-10-26 MED ORDER — CHLORHEXIDINE GLUCONATE 4 % EX LIQD: 60.0000 mL | Freq: Once | CUTANEOUS | Status: DC

## 2015-10-26 MED ORDER — DEXAMETHASONE SODIUM PHOSPHATE 10 MG/ML IJ SOLN: INTRAMUSCULAR | Status: DC | PRN

## 2015-10-26 MED ORDER — LACTATED RINGERS IV SOLN: INTRAVENOUS | Status: DC

## 2015-10-26 MED ORDER — LABETALOL HCL 5 MG/ML IV SOLN
INTRAVENOUS | Status: DC | PRN
  Administered 2015-10-26 (×3): 2.5 mg via INTRAVENOUS

## 2015-10-26 MED ORDER — HYDROCODONE-ACETAMINOPHEN 7.5-325 MG PO TABS
1.0000 | ORAL_TABLET | ORAL | Status: DC
  Administered 2015-10-26: 1 via ORAL
  Filled 2015-10-26: qty 2

## 2015-10-26 MED ORDER — LACTATED RINGERS IV SOLN
INTRAVENOUS | Status: DC
  Administered 2015-10-26: 1000 mL via INTRAVENOUS

## 2015-10-26 MED ORDER — LABETALOL HCL 5 MG/ML IV SOLN
INTRAVENOUS | Status: AC
  Filled 2015-10-26: qty 4

## 2015-10-26 MED ORDER — PROMETHAZINE HCL 25 MG/ML IJ SOLN: 6.2500 mg | INTRAMUSCULAR | Status: DC | PRN

## 2015-10-26 SURGICAL SUPPLY — 36 items
BAG DECANTER FOR FLEXI CONT (MISCELLANEOUS) IMPLANT
BAG SPEC THK2 15X12 ZIP CLS (MISCELLANEOUS)
BAG ZIPLOCK 12X15 (MISCELLANEOUS) IMPLANT
CAPT HIP TOTAL 2 ×1 IMPLANT
CLOTH BEACON ORANGE TIMEOUT ST (SAFETY) ×2 IMPLANT
COVER PERINEAL POST (MISCELLANEOUS) ×2 IMPLANT
DRAPE STERI IOBAN 125X83 (DRAPES) ×2 IMPLANT
DRAPE U-SHAPE 47X51 STRL (DRAPES) ×4 IMPLANT
DRSG AQUACEL AG ADV 3.5X10 (GAUZE/BANDAGES/DRESSINGS) ×2 IMPLANT
DURAPREP 26ML APPLICATOR (WOUND CARE) ×2 IMPLANT
ELECT REM PT RETURN 15FT ADLT (MISCELLANEOUS) IMPLANT
ELECT REM PT RETURN 9FT ADLT (ELECTROSURGICAL) ×2
ELECTRODE REM PT RTRN 9FT ADLT (ELECTROSURGICAL) ×1 IMPLANT
GLOVE BIOGEL M 7.0 STRL (GLOVE) IMPLANT
GLOVE BIOGEL PI IND STRL 7.5 (GLOVE) ×1 IMPLANT
GLOVE BIOGEL PI IND STRL 8.5 (GLOVE) ×1 IMPLANT
GLOVE BIOGEL PI INDICATOR 7.5 (GLOVE) ×3
GLOVE BIOGEL PI INDICATOR 8.5 (GLOVE) ×1
GLOVE ECLIPSE 8.0 STRL XLNG CF (GLOVE) ×4 IMPLANT
GLOVE ORTHO TXT STRL SZ7.5 (GLOVE) ×2 IMPLANT
GLOVE SURG SS PI 6.5 STRL IVOR (GLOVE) ×3 IMPLANT
GOWN STRL REUS W/TWL LRG LVL3 (GOWN DISPOSABLE) ×2 IMPLANT
GOWN STRL REUS W/TWL XL LVL3 (GOWN DISPOSABLE) ×2 IMPLANT
HOLDER FOLEY CATH W/STRAP (MISCELLANEOUS) ×2 IMPLANT
LIQUID BAND (GAUZE/BANDAGES/DRESSINGS) ×2 IMPLANT
PACK ANTERIOR HIP CUSTOM (KITS) ×2 IMPLANT
SAW OSC TIP CART 19.5X105X1.3 (SAW) ×2 IMPLANT
SUT MNCRL AB 4-0 PS2 18 (SUTURE) ×2 IMPLANT
SUT VIC AB 1 CT1 36 (SUTURE) ×6 IMPLANT
SUT VIC AB 2-0 CT1 27 (SUTURE) ×4
SUT VIC AB 2-0 CT1 TAPERPNT 27 (SUTURE) ×2 IMPLANT
SUT VLOC 180 0 24IN GS25 (SUTURE) ×2 IMPLANT
TRAY FOLEY W/METER SILVER 14FR (SET/KITS/TRAYS/PACK) IMPLANT
TRAY FOLEY W/METER SILVER 16FR (SET/KITS/TRAYS/PACK) ×1 IMPLANT
WATER STERILE IRR 1500ML POUR (IV SOLUTION) ×2 IMPLANT
YANKAUER SUCT BULB TIP 10FT TU (MISCELLANEOUS) ×1 IMPLANT

## 2015-10-26 NOTE — Discharge Instructions (Addendum)
INSTRUCTIONS AFTER JOINT REPLACEMENT  ° °o Remove items at home which could result in a fall. This includes throw rugs or furniture in walking pathways °o ICE to the affected joint every three hours while awake for 30 minutes at a time, for at least the first 3-5 days, and then as needed for pain and swelling.  Continue to use ice for pain and swelling. You may notice swelling that will progress down to the foot and ankle.  This is normal after surgery.  Elevate your leg when you are not up walking on it.   °o Continue to use the breathing machine you got in the hospital (incentive spirometer) which will help keep your temperature down.  It is common for your temperature to cycle up and down following surgery, especially at night when you are not up moving around and exerting yourself.  The breathing machine keeps your lungs expanded and your temperature down. ° ° °DIET:  As you were doing prior to hospitalization, we recommend a well-balanced diet. ° °DRESSING / WOUND CARE / SHOWERING ° °Keep the surgical dressing until follow up.  The dressing is water proof, so you can shower without any extra covering.  IF THE DRESSING FALLS OFF or the wound gets wet inside, change the dressing with sterile gauze.  Please use good hand washing techniques before changing the dressing.  Do not use any lotions or creams on the incision until instructed by your surgeon.   ° °ACTIVITY ° °o Increase activity slowly as tolerated, but follow the weight bearing instructions below.   °o No driving for 6 weeks or until further direction given by your physician.  You cannot drive while taking narcotics.  °o No lifting or carrying greater than 10 lbs. until further directed by your surgeon. °o Avoid periods of inactivity such as sitting longer than an hour when not asleep. This helps prevent blood clots.  °o You may return to work once you are authorized by your doctor.  ° ° ° °WEIGHT BEARING  ° °Weight bearing as tolerated with assist  device (walker, cane, etc) as directed, use it as long as suggested by your surgeon or therapist, typically at least 4-6 weeks. ° ° °EXERCISES ° °Results after joint replacement surgery are often greatly improved when you follow the exercise, range of motion and muscle strengthening exercises prescribed by your doctor. Safety measures are also important to protect the joint from further injury. Any time any of these exercises cause you to have increased pain or swelling, decrease what you are doing until you are comfortable again and then slowly increase them. If you have problems or questions, call your caregiver or physical therapist for advice.  ° °Rehabilitation is important following a joint replacement. After just a few days of immobilization, the muscles of the leg can become weakened and shrink (atrophy).  These exercises are designed to build up the tone and strength of the thigh and leg muscles and to improve motion. Often times heat used for twenty to thirty minutes before working out will loosen up your tissues and help with improving the range of motion but do not use heat for the first two weeks following surgery (sometimes heat can increase post-operative swelling).  ° °These exercises can be done on a training (exercise) mat, on the floor, on a table or on a bed. Use whatever works the best and is most comfortable for you.    Use music or television while you are exercising so that   the exercises are a pleasant break in your day. This will make your life better with the exercises acting as a break in your routine that you can look forward to.   Perform all exercises about fifteen times, three times per day or as directed.  You should exercise both the operative leg and the other leg as well. ° °Exercises include: °  °• Quad Sets - Tighten up the muscle on the front of the thigh (Quad) and hold for 5-10 seconds.   °• Straight Leg Raises - With your knee straight (if you were given a brace, keep it on),  lift the leg to 60 degrees, hold for 3 seconds, and slowly lower the leg.  Perform this exercise against resistance later as your leg gets stronger.  °• Leg Slides: Lying on your back, slowly slide your foot toward your buttocks, bending your knee up off the floor (only go as far as is comfortable). Then slowly slide your foot back down until your leg is flat on the floor again.  °• Angel Wings: Lying on your back spread your legs to the side as far apart as you can without causing discomfort.  °• Hamstring Strength:  Lying on your back, push your heel against the floor with your leg straight by tightening up the muscles of your buttocks.  Repeat, but this time bend your knee to a comfortable angle, and push your heel against the floor.  You may put a pillow under the heel to make it more comfortable if necessary.  ° °A rehabilitation program following joint replacement surgery can speed recovery and prevent re-injury in the future due to weakened muscles. Contact your doctor or a physical therapist for more information on knee rehabilitation.  ° ° °CONSTIPATION ° °Constipation is defined medically as fewer than three stools per week and severe constipation as less than one stool per week.  Even if you have a regular bowel pattern at home, your normal regimen is likely to be disrupted due to multiple reasons following surgery.  Combination of anesthesia, postoperative narcotics, change in appetite and fluid intake all can affect your bowels.  ° °YOU MUST use at least one of the following options; they are listed in order of increasing strength to get the job done.  They are all available over the counter, and you may need to use some, POSSIBLY even all of these options:   ° °Drink plenty of fluids (prune juice may be helpful) and high fiber foods °Colace 100 mg by mouth twice a day  °Senokot for constipation as directed and as needed Dulcolax (bisacodyl), take with full glass of water  °Miralax (polyethylene glycol)  once or twice a day as needed. ° °If you have tried all these things and are unable to have a bowel movement in the first 3-4 days after surgery call either your surgeon or your primary doctor.   ° °If you experience loose stools or diarrhea, hold the medications until you stool forms back up.  If your symptoms do not get better within 1 week or if they get worse, check with your doctor.  If you experience "the worst abdominal pain ever" or develop nausea or vomiting, please contact the office immediately for further recommendations for treatment. ° ° °ITCHING:  If you experience itching with your medications, try taking only a single pain pill, or even half a pain pill at a time.  You can also use Benadryl over the counter for itching or also to   help with sleep.   TED HOSE STOCKINGS:  Use stockings on both legs until for at least 2 weeks or as directed by physician office. They may be removed at night for sleeping.  MEDICATIONS:  See your medication summary on the After Visit Summary that nursing will review with you.  You may have some home medications which will be placed on hold until you complete the course of blood thinner medication.  It is important for you to complete the blood thinner medication as prescribed.  PRECAUTIONS:  If you experience chest pain or shortness of breath - call 911 immediately for transfer to the hospital emergency department.   If you develop a fever greater that 101 F, purulent drainage from wound, increased redness or drainage from wound, foul odor from the wound/dressing, or calf pain - CONTACT YOUR SURGEON.                                                   FOLLOW-UP APPOINTMENTS:  If you do not already have a post-op appointment, please call the office for an appointment to be seen by your surgeon.  Guidelines for how soon to be seen are listed in your After Visit Summary, but are typically between 1-4 weeks after surgery.  OTHER INSTRUCTIONS:   Knee  Replacement:  Do not place pillow under knee, focus on keeping the knee straight while resting.   MAKE SURE YOU:   Understand these instructions.   Get help right away if you are not doing well or get worse.    Thank you for letting us be a part of your medical care team.  It is a privilege we respect greatly.  We hope these instructions will help you stay on track for a fast and full recovery!        General Anesthesia, Adult, Care After Refer to this sheet in the next few weeks. These instructions provide you with information on caring for yourself after your procedure. Your health care provider may also give you more specific instructions. Your treatment has been planned according to current medical practices, but problems sometimes occur. Call your health care provider if you have any problems or questions after your procedure. WHAT TO EXPECT AFTER THE PROCEDURE After the procedure, it is typical to experience:  Sleepiness.  Nausea and vomiting. HOME CARE INSTRUCTIONS  For the first 24 hours after general anesthesia:  Have a responsible person with you.  Do not drive a car. If you are alone, do not take public transportation.  Do not drink alcohol.  Do not take medicine that has not been prescribed by your health care provider.  Do not sign important papers or make important decisions.  You may resume a normal diet and activities as directed by your health care provider.  Change bandages (dressings) as directed.  If you have questions or problems that seem related to general anesthesia, call the hospital and ask for the anesthetist or anesthesiologist on call. SEEK MEDICAL CARE IF:  You have nausea and vomiting that continue the day after anesthesia.  You develop a rash. SEEK IMMEDIATE MEDICAL CARE IF:   You have difficulty breathing.  You have chest pain.  You have any allergic problems.   This information is not intended to replace advice given to you by  your health care provider. Make sure  you discuss any questions you have with your health care provider.   Document Released: 11/20/2000 Document Revised: 09/04/2014 Document Reviewed: 12/13/2011 Elsevier Interactive Patient Education [employer].

## 2015-10-26 NOTE — Anesthesia Preprocedure Evaluation (Addendum)
Anesthesia Evaluation  Patient identified by MRN, date of birth, ID band Patient awake    Reviewed: Allergy & Precautions, NPO status , Patient's Chart, lab work & pertinent test results  Airway Mallampati: II  TM Distance: >3 FB Neck ROM: Full    Dental no notable dental hx. (+) Poor Dentition, Missing   Pulmonary neg pulmonary ROS,    Pulmonary exam normal breath sounds clear to auscultation       Cardiovascular hypertension, Pt. on medications (-) angina+ CAD (cath 2010. 30% LAD, 90% septal. medical management)  Normal cardiovascular exam Rhythm:Regular Rate:Normal     Neuro/Psych Severe spinal stenosis negative neurological ROS  negative psych ROS   GI/Hepatic hiatal hernia, GERD  Medicated and Controlled,(+)     substance abuse (8-10 beers per day)  alcohol use,   Endo/Other  negative endocrine ROS  Renal/GU negative Renal ROS  negative genitourinary   Musculoskeletal negative musculoskeletal ROS (+)   Abdominal   Peds negative pediatric ROS (+)  Hematology negative hematology ROS (+)   Anesthesia Other Findings   Reproductive/Obstetrics negative OB ROS                            Anesthesia Physical Anesthesia Plan  ASA: III  Anesthesia Plan: General   Post-op Pain Management:    Induction: Intravenous  Airway Management Planned: Oral ETT  Additional Equipment:   Intra-op Plan:   Post-operative Plan: Extubation in OR  Informed Consent: I have reviewed the patients History and Physical, chart, labs and discussed the procedure including the risks, benefits and alternatives for the proposed anesthesia with the patient or authorized representative who has indicated his/her understanding and acceptance.   Dental advisory given  Plan Discussed with: CRNA  Anesthesia Plan Comments:         Anesthesia Quick Evaluation

## 2015-10-26 NOTE — Anesthesia Postprocedure Evaluation (Signed)
Anesthesia Post Note  Patient: Samuel Heath  Procedure(s) Performed: Procedure(s) (LRB): LEFT TOTAL HIP ARTHROPLASTY ANTERIOR APPROACH (Left)  Patient location during evaluation: PACU Anesthesia Type: General Level of consciousness: awake and alert Pain management: pain level controlled Vital Signs Assessment: post-procedure vital signs reviewed and stable Respiratory status: spontaneous breathing, nonlabored ventilation, respiratory function stable and patient connected to nasal cannula oxygen Cardiovascular status: blood pressure returned to baseline and stable Postop Assessment: no signs of nausea or vomiting Anesthetic complications: no    Last Vitals:  Filed Vitals:   10/26/15 1245 10/26/15 1300  BP: 145/98   Pulse: 120   Temp:  36.6 C  Resp: 15     Last Pain:  Filed Vitals:   10/26/15 1310  PainSc: 0-No pain                 Montez Hageman

## 2015-10-26 NOTE — Evaluation (Signed)
Physical Therapy Evaluation Patient Details Name: Samuel Heath MRN: [id number] DOB: 07-Feb-1961 Today's Date: 10/26/2015   History of Present Illness  L THR 2* AVN  Clinical Impression  Pt s/p L THR presents with functional mobility limitations 2* decreased L LE strength/ROM and post op pain limiting functional mobility.  Pt to dc home this date sans HHPT per Dr Alvan Dame.  Reviewed car transfers, stairs and home therex with pt and spouse.    Follow Up Recommendations No PT follow up    Equipment Recommendations  None recommended by PT    Recommendations for Other Services       Precautions / Restrictions Precautions Precautions: Fall Restrictions Weight Bearing Restrictions: No Other Position/Activity Restrictions: WBAT      Mobility  Bed Mobility Overal bed mobility: Needs Assistance Bed Mobility: Supine to Sit     Supine to sit: Min assist     General bed mobility comments: MIn assist to manage L LE; Cues for sequence and use of R LE to self assist  Transfers Overall transfer level: Needs assistance Equipment used: Rolling walker (2 wheeled) Transfers: Sit to/from Stand Sit to Stand: Min guard         General transfer comment: cues for LE management and use of UEs to self assist  Ambulation/Gait Ambulation/Gait assistance: Min guard;Supervision Ambulation Distance (Feet): 60 Feet Assistive device: Rolling walker (2 wheeled) Gait Pattern/deviations: Step-to pattern;Decreased step length - right;Decreased step length - left;Shuffle;Trunk flexed Gait velocity: decr Gait velocity interpretation: Below normal speed for age/gender General Gait Details: cues for sequence, posture and position from RW;   Stairs Stairs: Yes Stairs assistance: Min assist Stair Management: One rail Left;Step to pattern;Forwards;With cane Number of Stairs: 5 General stair comments: cues for sequence and foot/cane placement  Wheelchair Mobility    Modified Rankin (Stroke Patients  Only)       Balance                                             Pertinent Vitals/Pain Pain Assessment: 0-10 Pain Score: 4  Pain Location: L hip Pain Descriptors / Indicators: Aching;Sore Pain Intervention(s): Limited activity within patient's tolerance;Monitored during session;Premedicated before session;Ice applied    Home Living Family/patient expects to be discharged to:: Private residence Living Arrangements: Spouse/significant other Available Help at Discharge: Family Type of Home: House Home Access: Stairs to enter Entrance Stairs-Rails: Psychiatric nurse of Steps: 7 Home Layout: One level Home Equipment: Environmental consultant - 2 wheels;Cane - single point;Bedside commode      Prior Function Level of Independence: Independent;Independent with assistive device(s)               Hand Dominance        Extremity/Trunk Assessment   Upper Extremity Assessment: Overall WFL for tasks assessed           Lower Extremity Assessment: LLE deficits/detail   LLE Deficits / Details: Strength at hip to 2+/5 with AAROM at hip to 80 flex and 10 abd  Cervical / Trunk Assessment: Normal  Communication   Communication: No difficulties  Cognition Arousal/Alertness: Awake/alert Behavior During Therapy: WFL for tasks assessed/performed Overall Cognitive Status: Within Functional Limits for tasks assessed                      General Comments      Exercises Total Joint Exercises Ankle  Circles/Pumps: AROM;Both;15 reps;Supine Quad Sets: AROM;Both;10 reps;Supine Heel Slides: AAROM;Left;15 reps;Supine Hip ABduction/ADduction: AAROM;Left;10 reps;Supine Long Arc Quad: AAROM;Left;5 reps;Seated      Assessment/Plan    PT Assessment Patient needs continued PT services  PT Diagnosis Difficulty walking   PT Problem List Decreased strength;Decreased range of motion;Decreased activity tolerance;Decreased balance;Decreased mobility;Decreased  knowledge of use of DME;Pain  PT Treatment Interventions DME instruction;Gait training;Stair training;Functional mobility training;Therapeutic activities;Therapeutic exercise;Patient/family education   PT Goals (Current goals can be found in the Care Plan section) Acute Rehab PT Goals Patient Stated Goal: Resume previous lifestyle with decreased pain PT Goal Formulation: With patient    Frequency     Barriers to discharge        Co-evaluation               End of Session Equipment Utilized During Treatment: Gait belt Activity Tolerance: Patient tolerated treatment well Patient left: in chair;with call bell/phone within reach;with family/visitor present Nurse Communication: Mobility status         Time: 1440-1525 PT Time Calculation (min) (ACUTE ONLY): 45 min   Charges:   PT Evaluation $PT Eval Low Complexity: 1 Procedure PT Treatments $Gait Training: 8-22 mins $Therapeutic Exercise: 8-22 mins   PT G Codes:        Allura Doepke November 23, 2015, 5:16 PM

## 2015-10-26 NOTE — Transfer of Care (Signed)
Immediate Anesthesia Transfer of Care Note  Patient: Samuel Heath  Procedure(s) Performed: Procedure(s): LEFT TOTAL HIP ARTHROPLASTY ANTERIOR APPROACH (Left)  Patient Location: PACU  Anesthesia Type:General  Level of Consciousness:  sedated, patient cooperative and responds to stimulation  Airway & Oxygen Therapy:Patient Spontanous Breathing and Patient connected to face mask oxgen  Post-op Assessment:  Report given to PACU RN and Post -op Vital signs reviewed and stable  Post vital signs:  Reviewed and stable  Last Vitals:  Filed Vitals:   10/26/15 0520 10/26/15 0935  BP: 136/93 139/93  Pulse: 101 102  Temp: 36.5 C 37 C  Resp: 15 12    Complications: No apparent anesthesia complications

## 2015-10-26 NOTE — Progress Notes (Signed)
Patient and wife state that they have walker with wheels and 3 in 1 bedside commode at home for use after left anterior hip surgery.

## 2015-10-26 NOTE — Anesthesia Procedure Notes (Addendum)
Procedure Name: Intubation Date/Time: 10/26/2015 7:17 AM Performed by: Anne Fu Pre-anesthesia Checklist: Patient identified, Emergency Drugs available, Suction available, Patient being monitored and Timeout performed Patient Re-evaluated:Patient Re-evaluated prior to inductionOxygen Delivery Method: Circle system utilized Preoxygenation: Pre-oxygenation with 100% oxygen Intubation Type: IV induction Ventilation: Mask ventilation without difficulty Laryngoscope Size: Mac and 4 Grade View: Grade II Tube type: Oral Tube size: 7.5 mm Number of attempts: 1 Airway Equipment and Method: Stylet Placement Confirmation: ETT inserted through vocal cords under direct vision,  positive ETCO2,  CO2 detector and breath sounds checked- equal and bilateral Secured at: 21 cm Tube secured with: Tape Dental Injury: Teeth and Oropharynx as per pre-operative assessment  Comments: Noted very large epiglottis that covered the glottic opening.

## 2015-10-26 NOTE — Interval H&P Note (Signed)
History and Physical Interval Note:  10/26/2015 7:07 AM  Samuel Heath  has presented today for surgery, with the diagnosis of OA WITH AVN LEFT HIP  The various methods of treatment have been discussed with the patient and family. After consideration of risks, benefits and other options for treatment, the patient has consented to  Procedure(s): LEFT TOTAL HIP ARTHROPLASTY ANTERIOR APPROACH (Left) as a surgical intervention .  The patient's history has been reviewed, patient examined, no change in status, stable for surgery.  I have reviewed the patient's chart and labs.  Questions were answered to the patient's satisfaction.     Mauri Pole

## 2015-10-26 NOTE — Op Note (Signed)
NAME:  Samuel Heath                ACCOUNT NO.: [id number]      MEDICAL RECORD NO.: [id number]      FACILITY:  [facility]      PHYSICIAN:  Paralee Cancel D  DATE OF BIRTH:  1960-10-19     DATE OF PROCEDURE:  10/26/2015                                 OPERATIVE REPORT         PREOPERATIVE DIAGNOSIS: Left  hip avascular necrosis related to alcohol consumption     POSTOPERATIVE DIAGNOSIS:  Left hip avascular necrosis related to alcohol consumption      PROCEDURE:  Left total hip replacement through an anterior approach   utilizing DePuy THR system, component size 34mm pinnacle cup, a size 36+4 neutral   Altrex liner, a size 8 Standard Tri Lock stem with a 36+5 delta ceramic   ball.      SURGEON:  Pietro Cassis. Alvan Dame, M.D.      ASSISTANT:  Danae Orleans, PA-C      ANESTHESIA:  General.      SPECIMENS:  None.      COMPLICATIONS:  None.      BLOOD LOSS:  500 cc     DRAINS:  None     INDICATION OF THE PROCEDURE:  Samuel Heath is a 55 y.o. male who had   presented to office for evaluation of left hip pain.  Radiographs revealed advanced stage avascular necrosis associated with history of alcohol consumption.  The radiographs revealed femoral head collapse and cystic changes.  The patient had painful limited range of   motion significantly affecting their overall quality of life related to the pain associated with the condition of his hip.  The patient was failing to    respond to conservative measures, and at this point was ready   to proceed with more definitive measures.  The patient has noted progressive   degenerative changes in his hip, progressive problems and dysfunction   with regarding the hip prior to surgery.  Consent was obtained for   benefit of pain relief.  Specific risk of infection, DVT, component   failure, dislocation, need for revision surgery, as well discussion of   the anterior versus posterior approach were reviewed.  Consent was   obtained for benefit of anterior pain relief through an anterior   approach.      PROCEDURE IN DETAIL:  The patient was brought to operative theater.   Once adequate anesthesia, preoperative antibiotics, 2gm of Ancef, 1 gm of Tranexamic Acid, and 10 mg of Decadron administered.   The patient was positioned supine on the OSI Hanna table.  Once adequate   padding of boney process was carried out, we had predraped out the hip, and  used fluoroscopy to confirm orientation of the pelvis and position.      The left hip was then prepped and draped from proximal iliac crest to   mid thigh with shower curtain technique.      Time-out was performed identifying the patient, planned procedure, and   extremity.     An incision was then made 2 cm distal and lateral to the   anterior superior iliac spine extending over the orientation of the   tensor fascia lata muscle and sharp dissection was carried  down to the   fascia of the muscle and protractor placed in the soft tissues.      The fascia was then incised.  The muscle belly was identified and swept   laterally and retractor placed along the superior neck.  Following   cauterization of the circumflex vessels and removing some pericapsular   fat, a second cobra retractor was placed on the inferior neck.  A third   retractor was placed on the anterior acetabulum after elevating the   anterior rectus.  A capsulectomy was performed due to the extensive degenerative changes associated with it and to aid in the exposure of this tight hip.  Retractors were then placed intracapsular.  We then identified the trochanteric fossa and   orientation of my neck cut, confirmed this radiographically   and then made a neck osteotomy with the femur on traction.  The femoral   head was removed without difficulty or complication.  Traction was let   off and retractors were placed posterior and anterior around the   acetabulum.      The labrum and foveal tissue were  debrided.  I began reaming with a 32mm   reamer and reamed up to 5mm reamer with good bony bed preparation and a 67mm   cup was chosen.  The final 75mm Pinnacle cup was then impacted under fluoroscopy  to confirm the depth of penetration and orientation with respect to   abduction.  A screw was placed followed by the hole eliminator.  The final   36+4 neutral Altrex liner was impacted with good visualized rim fit.  The cup was positioned anatomically within the acetabular portion of the pelvis.      At this point, the femur was rolled at 80 degrees.  Further capsule was   released off the inferior aspect of the femoral neck.  I then   released the superior capsule proximally.  The hook was placed laterally   along the femur and elevated manually and held in position with the bed   hook.  The leg was then extended and adducted with the leg rolled to 100   degrees of external rotation.  Once the proximal femur was fully   exposed, I used a box osteotome to set orientation.  I then began   broaching with the starting chili pepper broach and passed this by hand and then broached up to 8.  With the 8 broach in place I chose a standard offset neck and did a trial reduction.  The offset was appropriate, leg lengths   appeared to be equal, confirmed radiographically.   Given these findings, I went ahead and dislocated the hip, repositioned all   retractors and positioned the right hip in the extended and abducted position.  The final 8 Standard Tri Lock stem was   chosen and it was impacted down to the level of neck cut.  Based on this   and the trial reduction, a 36+5 delta ceramic ball was chosen and   impacted onto a clean and dry trunnion, and the hip was reduced.  The   hip had been irrigated throughout the case again at this point.  The fascia of the   tensor fascia lata muscle was then reapproximated using #1 Vicryl and # 0 V-lock sutures.  The   remaining wound was closed with 2-0 Vicryl and  running 4-0 Monocryl.   The hip was cleaned, dried, and dressed sterilely using Dermabond and   Aquacel dressing.  He was then brought   to recovery room in stable condition tolerating the procedure well.    Danae Orleans, PA-C was present for the entirety of the case involved from   preoperative positioning, perioperative retractor management, general   facilitation of the case, as well as primary wound closure as assistant.            Pietro Cassis Alvan Dame, M.D.        10/26/2015 9:05 AM

## 2015-11-01 NOTE — Discharge Summary (Signed)
Physician Discharge Summary  Patient ID: Samuel Heath MRN: [id number] DOB/AGE: 12-29-1960 55 y.o.  Admit date: 10/26/2015 Discharge date: 10/26/2015   Procedures:  Procedure(s) (LRB): LEFT TOTAL HIP ARTHROPLASTY ANTERIOR APPROACH (Left)  Attending Physician:  Dr. Paralee Cancel   Admission Diagnoses:   Left hip AVN / pain  Discharge Diagnoses:  Principal Problem:   S/P left THA, AA  Past Medical History  Diagnosis Date  . Gastroesophageal reflux disease with hiatal hernia   . Erectile dysfunction   . Hypertension   . Hypothyroidism   . Alcohol abuse   . Hyperlipidemia   . Statin intolerance     With atorvastatin  . Coronary artery disease     Coronary CTA (7/10) showed calcium score 424 and was indeterminant for coronary disease; LHC was done (7/10) showing EF 55%,30% proximal and mid LAD stenoses, 90% stenosis ostially in a moderate septal perforator, otherwise luminal irregularities; pt managed medically    HPI:    Samuel Heath, 55 y.o. male, has a history of pain and functional disability in the left hip(s) due to arthritis and AVN and patient has failed non-surgical conservative treatments for greater than 12 weeks to include NSAID's and/or analgesics, use of assistive devices and activity modification. Onset of symptoms was gradual starting 4+ months ago with rapidlly worsening course since that time.The patient noted no past surgery on the left hip(s). Patient currently rates pain in the left hip at 10 out of 10 with activity. Patient has night pain, worsening of pain with activity and weight bearing, trendelenberg gait, pain that interfers with activities of daily living and pain with passive range of motion. Patient has evidence of periarticular osteophytes and joint space narrowing by imaging studies. This condition presents safety issues increasing the risk of falls. There is no current active infection. Risks, benefits and expectations were discussed with the  patient. Risks including but not limited to the risk of anesthesia, blood clots, nerve damage, blood vessel damage, failure of the prosthesis, infection and up to and including death. Patient understand the risks, benefits and expectations and wishes to proceed with surgery.   PCP: Thersa Salt, DO   Discharged Condition: good  Hospital Course:  Patient underwent the above stated procedure on 10/26/2015. Patient tolerated the procedure well and brought to the recovery room in good condition and subsequently back to short stay.  The patient worked well with PT and he felt that he was doing well enough to be discharged home.  The discharge orders were reviewed with the patient by the nurse.  Patient know to call if there is any questions or concerns.    Discharge Exam: General appearance: alert, cooperative and no distress Extremities: Homans sign is negative, no sign of DVT, no edema, redness or tenderness in the calves or thighs and no ulcers, gangrene or trophic changes  Disposition: Home with follow up in 2 weeks   Follow-up Information    Follow up with Mauri Pole, MD. Schedule an appointment as soon as possible for a visit in 2 weeks.   Specialty:  Orthopedic Surgery   Contact information:   [address] [place] [address] [id number]       Discharge Instructions    Call MD / Call 911    Complete by:  As directed   If you experience chest pain or shortness of breath, CALL 911 and be transported to the hospital emergency room.  If you develope a fever above 101 F,  pus (white drainage) or increased drainage or redness at the wound, or calf pain, call your surgeon's office.     Change dressing    Complete by:  As directed   Maintain surgical dressing until follow up in the clinic. If the edges start to pull up, may reinforce with tape. If the dressing is no longer working, may remove and cover with gauze and tape, but must keep the area dry and clean.  Call  with any questions or concerns.     Constipation Prevention    Complete by:  As directed   Drink plenty of fluids.  Prune juice may be helpful.  You may use a stool softener, such as Colace (over the counter) 100 mg twice a day.  Use MiraLax (over the counter) for constipation as needed.     Diet - low sodium heart healthy    Complete by:  As directed      Discharge instructions    Complete by:  As directed   Maintain surgical dressing until follow up in the clinic. If the edges start to pull up, may reinforce with tape. If the dressing is no longer working, may remove and cover with gauze and tape, but must keep the area dry and clean.  Follow up in 2 weeks at [facility]. Call with any questions or concerns.     Increase activity slowly as tolerated    Complete by:  As directed   Weight bearing as tolerated with assist device (walker, cane, etc) as directed, use it as long as suggested by your surgeon or therapist, typically at least 4-6 weeks.     TED hose    Complete by:  As directed   Use stockings (TED hose) for 2 weeks on both leg(s).  You may remove them at night for sleeping.             Medication List    TAKE these medications        aspirin EC 325 MG tablet  Take 1 tablet (325 mg total) by mouth 2 (two) times daily. Take for 4 weeks, then resume regular dose.     cephALEXin 500 MG capsule  Commonly known as:  KEFLEX  Take 1 capsule (500 mg total) by mouth 3 (three) times daily.     HYDROcodone-acetaminophen 7.5-325 MG tablet  Commonly known as:  NORCO  Take 1-2 tablets by mouth every 4 (four) hours as needed for moderate pain.     lisinopril-hydrochlorothiazide 20-12.5 MG tablet  Commonly known as:  PRINZIDE,ZESTORETIC  Take 1 tablet by mouth daily.     methocarbamol 500 MG tablet  Commonly known as:  ROBAXIN  Take 1 tablet (500 mg total) by mouth every 6 (six) hours as needed for muscle spasms.     omeprazole 40 MG capsule  Commonly known as:   PRILOSEC  Take 40 mg by mouth daily.     pravastatin 40 MG tablet  Commonly known as:  PRAVACHOL  Take 1 tablet (40 mg total) by mouth daily.     PX COMPLETE SENIOR MULTIVITS Tabs  Take by mouth.     VITAMIN B 12 PO  Take 1 tablet by mouth daily.         Signed: [place]. Korrina Zern   PA-C  11/01/2015, 10:19 AM

## 2015-11-23 ENCOUNTER — Other Ambulatory Visit: Payer: BLUE CROSS/BLUE SHIELD

## 2015-12-09 ENCOUNTER — Other Ambulatory Visit: Payer: Self-pay | Admitting: Family Medicine

## 2015-12-09 ENCOUNTER — Telehealth: Payer: Self-pay | Admitting: *Deleted

## 2015-12-09 ENCOUNTER — Ambulatory Visit (INDEPENDENT_AMBULATORY_CARE_PROVIDER_SITE_OTHER): Payer: BLUE CROSS/BLUE SHIELD | Admitting: Family Medicine

## 2015-12-09 ENCOUNTER — Encounter: Payer: Self-pay | Admitting: Family Medicine

## 2015-12-09 ENCOUNTER — Telehealth: Payer: Self-pay | Admitting: Family Medicine

## 2015-12-09 VITALS — BP 112/76 | HR 98 | Temp 98.2°F | Ht 70.0 in | Wt 251.5 lb

## 2015-12-09 DIAGNOSIS — G2581 Restless legs syndrome: Secondary | ICD-10-CM

## 2015-12-09 DIAGNOSIS — M25512 Pain in left shoulder: Secondary | ICD-10-CM | POA: Diagnosis not present

## 2015-12-09 DIAGNOSIS — M25522 Pain in left elbow: Secondary | ICD-10-CM

## 2015-12-09 DIAGNOSIS — I10 Essential (primary) hypertension: Secondary | ICD-10-CM | POA: Diagnosis not present

## 2015-12-09 DIAGNOSIS — E785 Hyperlipidemia, unspecified: Secondary | ICD-10-CM

## 2015-12-09 MED ORDER — GABAPENTIN ENACARBIL ER 600 MG PO TBCR: EXTENDED_RELEASE_TABLET | ORAL | Status: DC

## 2015-12-09 MED ORDER — GABAPENTIN 300 MG PO CAPS: 300.0000 mg | ORAL_CAPSULE | Freq: Every day | ORAL | Status: DC

## 2015-12-09 MED ORDER — PREDNISONE 10 MG (48) PO TBPK: ORAL_TABLET | Freq: Every day | ORAL | Status: DC

## 2015-12-09 MED ORDER — PRAVASTATIN SODIUM 40 MG PO TABS: 40.0000 mg | ORAL_TABLET | Freq: Every day | ORAL | Status: DC

## 2015-12-09 NOTE — Assessment & Plan Note (Signed)
Well controlled. Continue Lisinopril/HCTZ. 

## 2015-12-09 NOTE — Patient Instructions (Signed)
Take the medications as prescribed.  Follow up later this year.  Take care  Dr. Lacinda Axon

## 2015-12-09 NOTE — Assessment & Plan Note (Signed)
New problem. Treating with gabapentin.

## 2015-12-09 NOTE — Assessment & Plan Note (Signed)
New problem. Medial epicondylitis. Treating with prednisone.

## 2015-12-09 NOTE — Telephone Encounter (Signed)
Samuel Heath called from [place] questioning the type of Gabapentin called to the pharmacy . She stated that the Gabapentin Enacarbil 600 MG TBCR was a very expensive time release medication that they would have to order . They do have Gabapentin the immediate release in stock. Pharmacist wanted to make sure that the physician intended to send that form of Gabapentin to the pharmacy.

## 2015-12-09 NOTE — Telephone Encounter (Signed)
Dr. Lacinda Axon please advise a alternate medication. thanks

## 2015-12-09 NOTE — Progress Notes (Signed)
Subjective:  Patient ID: Samuel Heath, male    DOB: 10/12/1960  Age: 55 y.o. MRN: [id number]  CC: Restless legs, Shoulder pain, Elbow pain  HPI:  55 year old male past medical history of CAD, hypertension, hyperlipidemia, alcohol abuse presents with the above complaints.  Restless leg  Patient has recently been experiencing restless legs at night and difficulty sleeping.  He states that as he is falling asleep his legs are restless and shaky.  He states that this is making him anxious and making sleep difficult.  He informed his surgeon of this and he recommended that he see me for evaluation.  No known exacerbating or relieving factors.  He is requesting medication today.  L shoulder pain & L Elbow pain  Patient reports a 2 to 3 month history of left elbow and left shoulder pain.  He attributes this to a lifetime of heavy lifting and activity.  He states that this is been worsening.  Particular bothersome with activity.  No known relieving factors.  Pain is moderate to severe.  No recent fall, trauma, injury.  HTN  Well controlled on lisinopril/HCTZ.  HLD  Uncontrolled.  Patient is not currently taking his cholesterol medication.  Would recommend high intensity statin the patient has not tolerated Lipitor in the past. Will discuss today.  Social Hx   Social History   Social History  . Marital Status: Married    Spouse Name: N/A  . Number of Children: N/A  . Years of Education: N/A   Occupational History  . Truck Surveyor, mining mobile homes   Social History Main Topics  . Smoking status: Never Smoker   . Smokeless tobacco: Former Systems developer     Comment: Denies tobacco use  . Alcohol Use: 36.0 oz/week    60 Cans of beer per week     Comment: More than a 12-pack of beer a day.10-20-15 now down to 6 beers /day  . Drug Use: No  . Sexual Activity: Not Asked   Other Topics Concern  . None   Social History Narrative   Lives in [place] with his  girlfriend but she is getting ready to move out.   Review of Systems  Musculoskeletal: Positive for arthralgias.  Neurological:       Restless legs.   Objective:  BP 112/76 mmHg  Pulse 98  Temp(Src) 98.2 F (36.8 C) (Oral)  Ht 5\' 10"  (1.778 m)  Wt 251 lb 8 oz (114.08 kg)  BMI 36.09 kg/m2  SpO2 97%  BP/Weight 12/09/2015 10/20/2015 [id number]  Systolic BP [id number]  Diastolic BP 76 94 70  Wt. (Lbs) 251.5 254 253  BMI 36.09 36.45 36.3   Physical Exam  Constitutional: He is oriented to person, place, and time. He appears well-developed. No distress.  HENT:  Head: Normocephalic and atraumatic.  Eyes: Conjunctivae are normal. No scleral icterus.  Pulmonary/Chest: Effort normal.  Musculoskeletal:  Shoulder: Left Inspection reveals no abnormalities, atrophy or asymmetry. Palpation is normal with no tenderness over AC joint or bicipital groove. ROM is full in all planes. Rotator cuff strength 4/5 - supraspinatous, infraspinatus, teres minor. Normal subscap strength.  Elbow: Unremarkable to inspection. Range of motion full pronation, supination, flexion, extension. Strength is full to all of the above directions Tender over medial epicondyle.     Neurological: He is alert and oriented to person, place, and time.  No focal deficits.  Psychiatric: He has a normal mood and affect.  Vitals reviewed.  Lab Results  Component Value Date   WBC 9.2 10/01/2015   HGB 15.9 10/01/2015   HCT 47.4 10/01/2015   PLT 327.0 10/01/2015   GLUCOSE 109* 10/01/2015   CHOL 255* 10/01/2015   TRIG 261.0* 10/01/2015   HDL 43.80 10/01/2015   LDLDIRECT 175.0 10/01/2015   LDLCALC * 03/07/2009    220        Total Cholesterol/HDL:CHD Risk Coronary Heart Disease Risk Table                     Men   Women  1/2 Average Risk   3.4   3.3  Average Risk       5.0   4.4  2 X Average Risk   9.6   7.1  3 X Average Risk  23.4   11.0        Use the calculated Patient Ratio above and the CHD Risk  Table to determine the patient's CHD Risk.        ATP III CLASSIFICATION (LDL):  <100     mg/dL   Optimal  100-129  mg/dL   Near or Above                    Optimal  130-159  mg/dL   Borderline  160-189  mg/dL   High  >190     mg/dL   Very High   ALT 35 10/01/2015   AST 25 10/01/2015   NA 135 10/01/2015   K 4.5 10/01/2015   CL 99 10/01/2015   CREATININE 0.90 10/01/2015   BUN 18 10/01/2015   CO2 28 10/01/2015   TSH 2.26 10/01/2015   INR 1.01 10/20/2015   HGBA1C 5.5 10/01/2015   Assessment & Plan:   Problem List Items Addressed This Visit    Hyperlipidemia    Pravachol prescribed today.      Relevant Medications   pravastatin (PRAVACHOL) 40 MG tablet   Essential hypertension    Well controlled. Continue Lisinopril/HCTZ.      Relevant Medications   pravastatin (PRAVACHOL) 40 MG tablet   Restless leg - Primary    New problem. Treating with gabapentin.      Left shoulder pain    Likely rotator cuff syndrome vs bursitis. Given elbow pain treating with course of prednisone.      Left elbow pain    New problem. Medial epicondylitis. Treating with prednisone.         Meds ordered this encounter  Medications  . DISCONTD: predniSONE (STERAPRED UNI-PAK 48 TAB) 10 MG (48) TBPK tablet    Sig: Take by mouth daily. Per package instructions.    Dispense:  48 tablet    Refill:  0  . DISCONTD: Gabapentin Enacarbil 600 MG TBCR    Sig: Take one tablet daily in the evening (5 pm).    Dispense:  90 tablet    Refill:  0  . predniSONE (STERAPRED UNI-PAK 48 TAB) 10 MG (48) TBPK tablet    Sig: Take by mouth daily. Per package instructions.    Dispense:  48 tablet    Refill:  0  . Gabapentin Enacarbil 600 MG TBCR    Sig: Take one tablet daily in the evening (5 pm).    Dispense:  90 tablet    Refill:  0  . pravastatin (PRAVACHOL) 40 MG tablet    Sig: Take 1 tablet (40 mg total) by mouth daily.    Dispense:  90 tablet  Refill:  3    Follow-up: Later this year.    [place]

## 2015-12-09 NOTE — Telephone Encounter (Signed)
I have sent a message to Dr. Lacinda Axon regarding this medication.  Thanks

## 2015-12-09 NOTE — Telephone Encounter (Signed)
New rx sent

## 2015-12-09 NOTE — Progress Notes (Signed)
Pre visit review using our clinic review tool, if applicable. No additional management support is needed unless otherwise documented below in the visit note. 

## 2015-12-09 NOTE — Telephone Encounter (Signed)
Patient was seen in the office, today for restless legs. He was prescribed a medication By Dr.Cook, the medication will be to costly to fill, patient requested another medication. Please advise  Pt contact [phone]

## 2015-12-09 NOTE — Assessment & Plan Note (Signed)
Likely rotator cuff syndrome vs bursitis. Given elbow pain treating with course of prednisone.

## 2015-12-09 NOTE — Assessment & Plan Note (Signed)
Pravachol prescribed today.

## 2015-12-09 NOTE — Telephone Encounter (Signed)
Pt made aware of the new prescription sent into the pharmacy.

## 2015-12-14 ENCOUNTER — Telehealth: Payer: Self-pay | Admitting: Cardiovascular Disease

## 2015-12-14 NOTE — Telephone Encounter (Signed)
Left message on machine for patient to contact the office to reschedule lipid and liver profile.

## 2015-12-20 NOTE — Telephone Encounter (Signed)
Left message on machine for patient to contact the office.   

## 2015-12-21 NOTE — Telephone Encounter (Signed)
Left detailed message on home phone regarding rescheduling labs. Left call back number Letter mailed.

## 2016-01-16 ENCOUNTER — Emergency Department (HOSPITAL_COMMUNITY)
Admission: EM | Admit: 2016-01-16 | Discharge: 2016-01-16 | Disposition: A | Discharge status: Home / Self Care | Payer: BLUE CROSS/BLUE SHIELD | Attending: Emergency Medicine | Admitting: Emergency Medicine

## 2016-01-16 ENCOUNTER — Encounter (HOSPITAL_COMMUNITY): Payer: Self-pay | Admitting: Emergency Medicine

## 2016-01-16 ENCOUNTER — Emergency Department (HOSPITAL_COMMUNITY): Payer: BLUE CROSS/BLUE SHIELD

## 2016-01-16 DIAGNOSIS — Z79899 Other long term (current) drug therapy: Secondary | ICD-10-CM | POA: Diagnosis not present

## 2016-01-16 DIAGNOSIS — Z9889 Other specified postprocedural states: Secondary | ICD-10-CM | POA: Diagnosis not present

## 2016-01-16 DIAGNOSIS — I10 Essential (primary) hypertension: Secondary | ICD-10-CM | POA: Diagnosis not present

## 2016-01-16 DIAGNOSIS — E785 Hyperlipidemia, unspecified: Secondary | ICD-10-CM | POA: Diagnosis not present

## 2016-01-16 DIAGNOSIS — K219 Gastro-esophageal reflux disease without esophagitis: Secondary | ICD-10-CM | POA: Insufficient documentation

## 2016-01-16 DIAGNOSIS — Z87438 Personal history of other diseases of male genital organs: Secondary | ICD-10-CM | POA: Diagnosis not present

## 2016-01-16 DIAGNOSIS — R079 Chest pain, unspecified: Secondary | ICD-10-CM | POA: Diagnosis present

## 2016-01-16 DIAGNOSIS — R110 Nausea: Secondary | ICD-10-CM | POA: Diagnosis not present

## 2016-01-16 DIAGNOSIS — R0789 Other chest pain: Secondary | ICD-10-CM

## 2016-01-16 DIAGNOSIS — I2510 Atherosclerotic heart disease of native coronary artery without angina pectoris: Secondary | ICD-10-CM | POA: Diagnosis not present

## 2016-01-16 DIAGNOSIS — Z7952 Long term (current) use of systemic steroids: Secondary | ICD-10-CM | POA: Diagnosis not present

## 2016-01-16 LAB — BASIC METABOLIC PANEL
Anion gap: 9 (ref 5–15)
BUN: 12 mg/dL (ref 6–20)
CO2: 26 mmol/L (ref 22–32)
Calcium: 9.2 mg/dL (ref 8.9–10.3)
Chloride: 102 mmol/L (ref 101–111)
Creatinine, Ser: 0.83 mg/dL (ref 0.61–1.24)
GFR calc Af Amer: >60 mL/min (ref >60)
GFR calc non Af Amer: >60 mL/min (ref >60)
Glucose, Bld: 100 mg/dL — ABNORMAL HIGH (ref 65–99)
Potassium: 4.3 mmol/L (ref 3.5–5.1)
Sodium: 137 mmol/L (ref 135–145)

## 2016-01-16 LAB — CBC
HCT: 41.7 % (ref 39.0–52.0)
Hemoglobin: 13.7 g/dL (ref 13.0–17.0)
MCH: 30.6 pg (ref 26.0–34.0)
MCHC: 32.9 g/dL (ref 30.0–36.0)
MCV: 93.1 fL (ref 78.0–100.0)
Platelets: 246 10*3/uL (ref 150–400)
RBC: 4.48 MIL/uL (ref 4.22–5.81)
RDW: 12.9 % (ref 11.5–15.5)
WBC: 5.8 10*3/uL (ref 4.0–10.5)

## 2016-01-16 LAB — I-STAT TROPONIN, ED
Troponin i, poc: 0 ng/mL (ref 0.00–0.08)
Troponin i, poc: 0 ng/mL (ref 0.00–0.08)

## 2016-01-16 LAB — TROPONIN I: Troponin I: <0.03 ng/mL (ref <0.031)

## 2016-01-16 LAB — D-DIMER, QUANTITATIVE: D-Dimer, Quant: 0.56 ug/mL-FEU — ABNORMAL HIGH (ref 0.00–0.50)

## 2016-01-16 MED ORDER — NITROGLYCERIN 0.4 MG SL SUBL
0.4000 mg | SUBLINGUAL_TABLET | SUBLINGUAL | Status: DC | PRN
  Administered 2016-01-16: 0.4 mg via SUBLINGUAL
  Filled 2016-01-16: qty 1

## 2016-01-16 MED ORDER — ASPIRIN 81 MG PO CHEW
324.0000 mg | CHEWABLE_TABLET | Freq: Once | ORAL | Status: AC
  Administered 2016-01-16: 324 mg via ORAL
  Filled 2016-01-16: qty 4

## 2016-01-16 NOTE — ED Notes (Signed)
Pt out of room for testing. 

## 2016-01-16 NOTE — ED Notes (Signed)
Pt returns from radiology placed back on cardiac monitoring. 

## 2016-01-16 NOTE — ED Notes (Signed)
Pt verbalized understanding of discharge instructions and follow up care

## 2016-01-16 NOTE — ED Notes (Signed)
Patient transported to X-ray 
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
## 2016-01-16 NOTE — Discharge Instructions (Signed)
Return immediately to the ED for worsening chest pain, chest pain associated with passing out, t, or other new or worsening symptoms.

## 2016-01-16 NOTE — ED Provider Notes (Signed)
I saw and evaluated the patient, reviewed the resident's note and I agree with the findings and plan.  Pertinent History: The pt has hx of no CAD - has had non obstructive cath in the past as well as stress earlier this year prior to hip surgery in February - awoke at 2 AM with sharp and stabbing L sided CP - intermittent-  None at this time Pertinent Exam findings: No swelling of the lower extremities, no asymmetry, clear heart and lung sounds, mild tachycardia, the EKG is unremarkable, the labs thus far are unremarkable, I will add a d-dimer, he will get a second troponin   EKG Interpretation  Date/Time:  Sunday Jan 16 2016 13:30:58 EDT Ventricular Rate:  92 PR Interval:  136 QRS Duration: 78 QT Interval:  338 QTC Calculation: 417 R Axis:   -9 Text Interpretation:  Normal sinus rhythm Minimal voltage criteria for LVH, may be normal variant Borderline ECG since last tracing no significant change Confirmed by Fawn Desrocher  MD, Mizani Dilday ([id number]) on 01/16/2016 4:00:38 PM       I personally interpreted the EKG as well as the resident and agree with the interpretation on the resident's chart.  Final diagnoses:  Chest pain of uncertain etiology      Noemi Chapel, MD 01/17/16 [phone]

## 2016-01-16 NOTE — ED Notes (Signed)
Pt. Stated, I started having chest apin last night while lying in bed it has continued to , Now I feel a little nauseated.

## 2016-02-17 ENCOUNTER — Encounter: Payer: Self-pay | Admitting: Family Medicine

## 2016-02-17 ENCOUNTER — Ambulatory Visit (INDEPENDENT_AMBULATORY_CARE_PROVIDER_SITE_OTHER): Payer: BLUE CROSS/BLUE SHIELD | Admitting: Family Medicine

## 2016-02-17 VITALS — BP 152/96 | HR 95 | Temp 98.3°F | Ht 70.0 in | Wt 264.2 lb

## 2016-02-17 DIAGNOSIS — M7702 Medial epicondylitis, left elbow: Secondary | ICD-10-CM

## 2016-02-17 DIAGNOSIS — M7700 Medial epicondylitis, unspecified elbow: Secondary | ICD-10-CM | POA: Insufficient documentation

## 2016-02-17 MED ORDER — TRAMADOL HCL 50 MG PO TABS: 50.0000 mg | ORAL_TABLET | Freq: Three times a day (TID) | ORAL | Status: DC | PRN

## 2016-02-17 MED ORDER — PRAVASTATIN SODIUM 40 MG PO TABS: 40.0000 mg | ORAL_TABLET | Freq: Every day | ORAL | Status: DC

## 2016-02-17 NOTE — Patient Instructions (Signed)
Take the medication as prescribed.  Take aleve as well.  Start the cholesterol medication. Follow up labs in 4-6 weeks.  Take care  Dr. Lacinda Axon

## 2016-02-17 NOTE — Assessment & Plan Note (Signed)
Established problem, worsening. Sending to sports med for consideration for injection. Treating with Tramadol.

## 2016-02-17 NOTE — Progress Notes (Signed)
Subjective:  Patient ID: Samuel Heath, male    DOB: [birth year]  Age: 55 y.o. MRN: [id number]  CC: Left elbow pain  HPI:  55 year old male with a history of left elbow pain presents with worsening left elbow pain.  Patient states that his pain has recently been worsening. Pain is located at the medial aspect of the left elbow. He reports that it swollen and tender. He states that it interferes with his ability to lift things. No recent trauma, fall, injury. I treated him previously with a prednisone course and he had improvement. No current relieving factors. Pain is severe.  Additionally, patient states that he has not received his pravastatin and would like a refill today.  Social Hx   Social History   Social History  . Marital Status: Married    Spouse Name: N/A  . Number of Children: N/A  . Years of Education: N/A   Occupational History  . Truck Surveyor, mining mobile homes   Social History Main Topics  . Smoking status: Never Smoker   . Smokeless tobacco: Former Systems developer     Comment: Denies tobacco use  . Alcohol Use: 36.0 oz/week    60 Cans of beer per week     Comment: More than a 12-pack of beer a day.10-20-15 now down to 6 beers /day  . Drug Use: No  . Sexual Activity: Not Asked   Other Topics Concern  . None   Social History Narrative   Lives in [place] with his girlfriend but she is getting ready to move out.   Review of Systems  Constitutional: Negative.   Musculoskeletal:       Left elbow pain.   Objective:  BP 152/96 mmHg  Pulse 95  Temp(Src) 98.3 F (36.8 C) (Oral)  Ht 5\' 10"  (1.778 m)  Wt 264 lb 4 oz (119.863 kg)  BMI 37.92 kg/m2  SpO2 97%  BP/Weight 02/17/2016 01/16/2016 [id number]  Systolic BP 0000000 [id number]  Diastolic BP 96 97 76  Wt. (Lbs) 264.25 252 251.5  BMI 37.92 32.34 36.09   Physical Exam  Constitutional: He is oriented to person, place, and time. He appears well-developed. No distress.  HENT:  Head: Normocephalic and atraumatic.   Eyes: Conjunctivae are normal. No scleral icterus.  Musculoskeletal:  Left elbow - exquisite tenderness to palpation of the medial epicondyle. Mild swelling.  Neurological: He is alert and oriented to person, place, and time.  Psychiatric: He has a normal mood and affect.  Vitals reviewed.  Lab Results  Component Value Date   WBC 5.8 01/16/2016   HGB 13.7 01/16/2016   HCT 41.7 01/16/2016   PLT 246 01/16/2016   GLUCOSE 100* 01/16/2016   CHOL 255* 10/01/2015   TRIG 261.0* 10/01/2015   HDL 43.80 10/01/2015   LDLDIRECT 175.0 10/01/2015   LDLCALC * 03/07/2009    220        Total Cholesterol/HDL:CHD Risk Coronary Heart Disease Risk Table                     Men   Women  1/2 Average Risk   3.4   3.3  Average Risk       5.0   4.4  2 X Average Risk   9.6   7.1  3 X Average Risk  23.4   11.0        Use the calculated Patient Ratio above and the CHD Risk Table to determine the patient's  CHD Risk.        ATP III CLASSIFICATION (LDL):  <100     mg/dL   Optimal  100-129  mg/dL   Near or Above                    Optimal  130-159  mg/dL   Borderline  160-189  mg/dL   High  >190     mg/dL   Very High   ALT 35 10/01/2015   AST 25 10/01/2015   NA 137 01/16/2016   K 4.3 01/16/2016   CL 102 01/16/2016   CREATININE 0.83 01/16/2016   BUN 12 01/16/2016   CO2 26 01/16/2016   TSH 2.26 10/01/2015   INR 1.01 10/20/2015   HGBA1C 5.5 10/01/2015   Assessment & Plan:   Problem List Items Addressed This Visit    Medial epicondylitis - Primary    Established problem, worsening. Sending to sports med for consideration for injection. Treating with Tramadol.       Relevant Medications   traMADol (ULTRAM) 50 MG tablet   Other Relevant Orders   Ambulatory referral to Sports Medicine     Meds ordered this encounter  Medications  . pravastatin (PRAVACHOL) 40 MG tablet    Sig: Take 1 tablet (40 mg total) by mouth daily.    Dispense:  90 tablet    Refill:  3  . traMADol (ULTRAM) 50 MG  tablet    Sig: Take 1-2 tablets (50-100 mg total) by mouth every 8 (eight) hours as needed.    Dispense:  60 tablet    Refill:  0   Follow-up: Return for labs.  [place]

## 2016-02-25 ENCOUNTER — Encounter: Payer: Self-pay | Admitting: Family Medicine

## 2016-02-25 ENCOUNTER — Other Ambulatory Visit: Payer: Self-pay

## 2016-02-25 ENCOUNTER — Ambulatory Visit (INDEPENDENT_AMBULATORY_CARE_PROVIDER_SITE_OTHER): Payer: BLUE CROSS/BLUE SHIELD | Admitting: Family Medicine

## 2016-02-25 VITALS — BP 138/86 | HR 102 | Resp 16 | Wt 262.0 lb

## 2016-02-25 DIAGNOSIS — M7702 Medial epicondylitis, left elbow: Secondary | ICD-10-CM

## 2016-02-25 DIAGNOSIS — M25522 Pain in left elbow: Secondary | ICD-10-CM

## 2016-02-25 MED ORDER — DICLOFENAC SODIUM 2 % TD SOLN: 2.0000 "application " | Freq: Two times a day (BID) | TRANSDERMAL | Status: DC

## 2016-02-25 NOTE — Patient Instructions (Signed)
Good to see you  Ice 20 minutes 2 times daily. Usually after activity and before bed. Wear brace day and night for 1 week then nightly for 2 weeks.  Avoid any lifting or pulling  Underhand  pennsaid pinkie amount topically 2 times daily as needed.  Exercises 3 times a week.  See me again in 4 weeks.

## 2016-02-25 NOTE — Progress Notes (Signed)
Pre visit review using our clinic review tool, if applicable. No additional management support is needed unless otherwise documented below in the visit note. 
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
## 2016-02-25 NOTE — Assessment & Plan Note (Signed)
Patient given injection today. Patient will be immobilized in a splint for short course. We discussed icing regimen and given up her prescription for topical anti-inflammatories. We discussed which activities to avoid. Home exercises given. Follow-up again in 4 weeks. Worsening symptoms we will discuss with him about the possibility of nitroglycerin though with patient's history of cardiovascular disease we will need to be conservative.

## 2016-03-02 IMAGING — DX DG HIP (WITH OR WITHOUT PELVIS) 1V PORT*L*
2 series · 2 of 2 positions shown · non-contrast
Comparison: 05/13/2015

CLINICAL DATA: Status post left hip replacement

EXAM:
DG HIP (WITH OR WITHOUT PELVIS) 1V PORT LEFT

[pelvis ap]
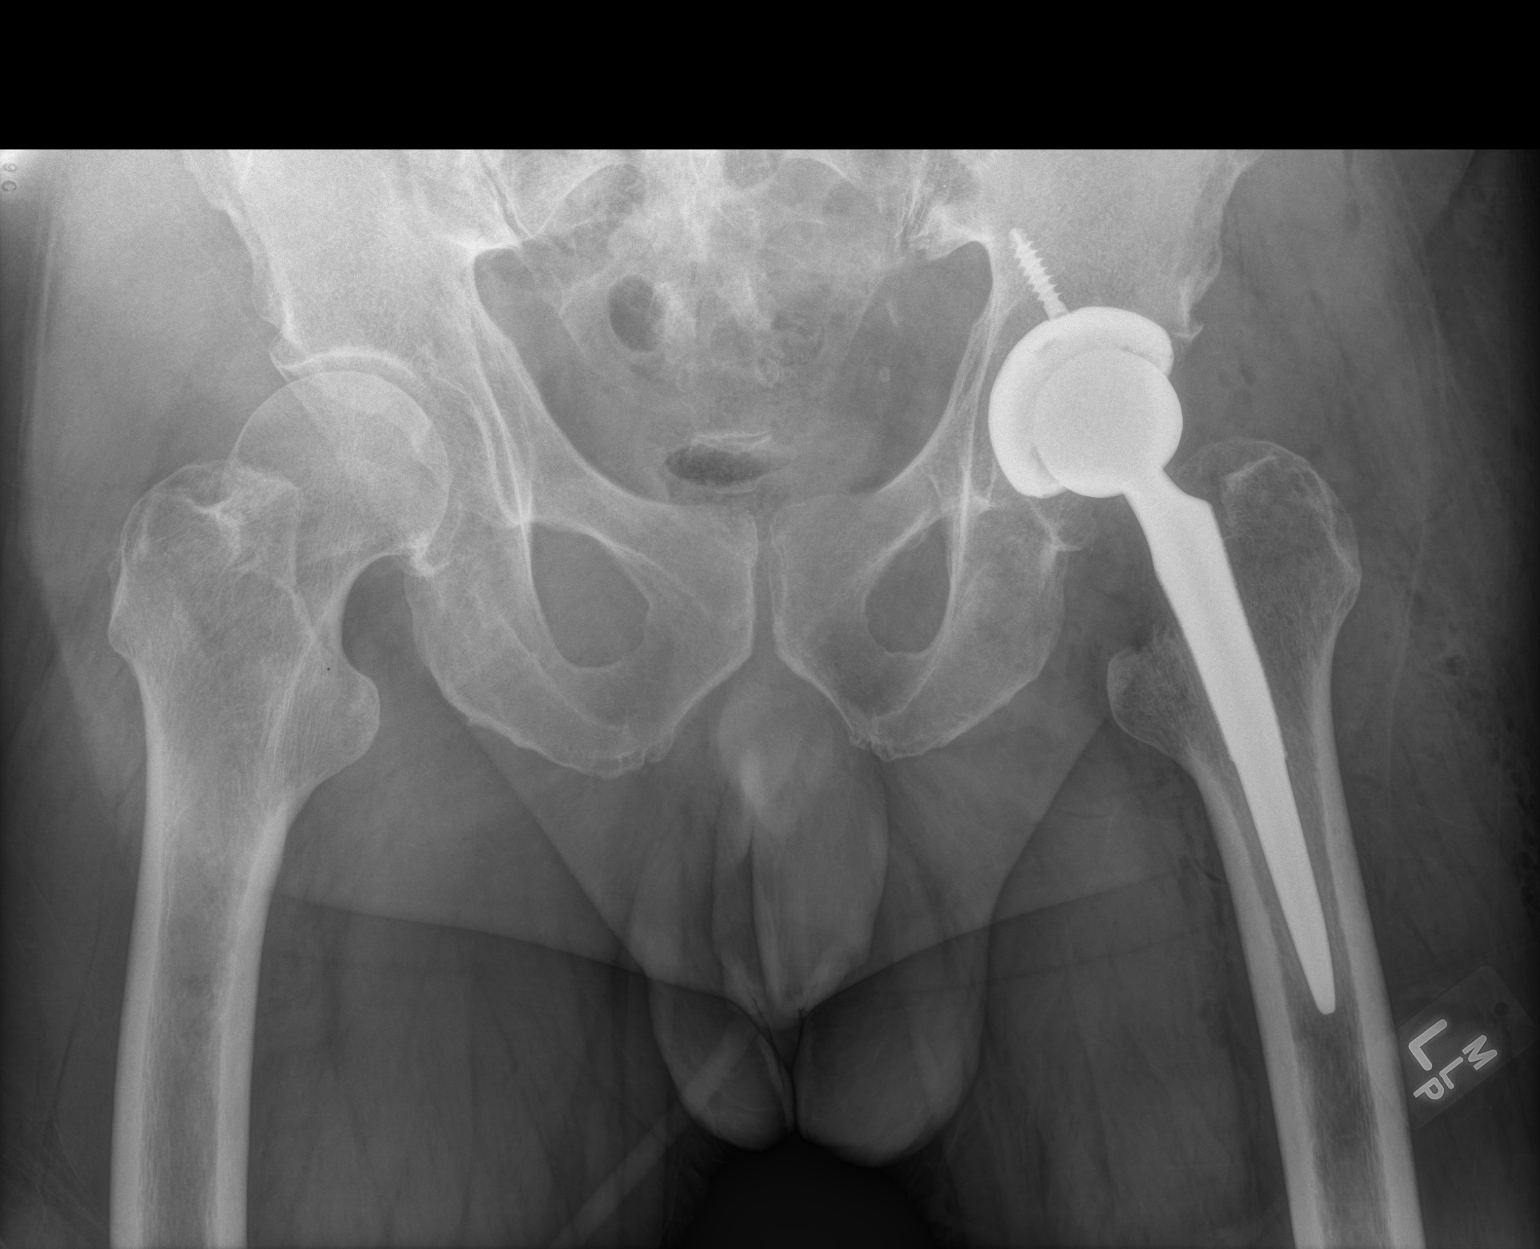

[hip x-table]
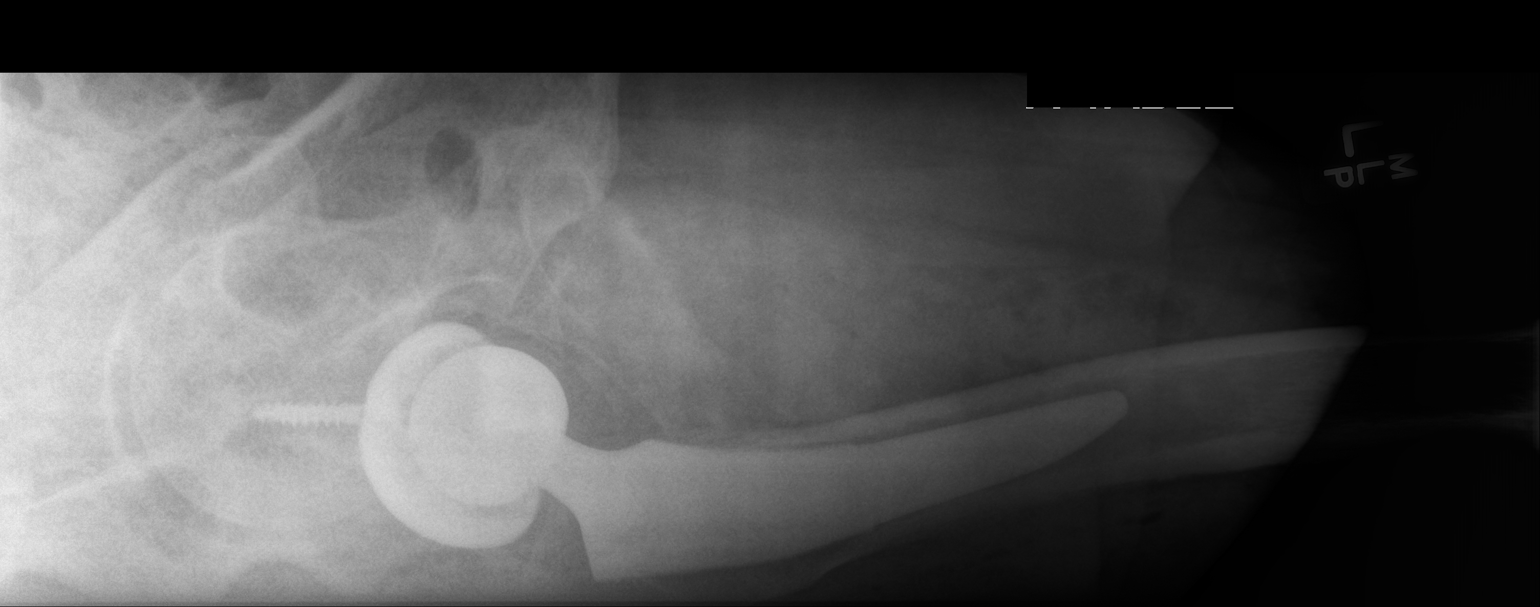

[2 of 2 positions shown; findings below may reference images not displayed]

FINDINGS: Two views of the left hip submitted including frontal view of the
pelvis. There is left hip prosthesis with anatomic alignment.
Postsurgical changes are noted with small amount of periarticular
soft tissue air.
IMPRESSION: Left hip prosthesis with anatomic alignment. Postsurgical changes
are noted with small periarticular soft tissue air.
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
## 2016-03-24 ENCOUNTER — Ambulatory Visit: Payer: BLUE CROSS/BLUE SHIELD | Admitting: Family Medicine

## 2016-05-05 ENCOUNTER — Ambulatory Visit (INDEPENDENT_AMBULATORY_CARE_PROVIDER_SITE_OTHER): Payer: BLUE CROSS/BLUE SHIELD | Admitting: Podiatry

## 2016-05-05 ENCOUNTER — Encounter: Payer: Self-pay | Admitting: Podiatry

## 2016-05-05 ENCOUNTER — Ambulatory Visit (INDEPENDENT_AMBULATORY_CARE_PROVIDER_SITE_OTHER): Payer: BLUE CROSS/BLUE SHIELD

## 2016-05-05 DIAGNOSIS — M779 Enthesopathy, unspecified: Secondary | ICD-10-CM

## 2016-05-05 DIAGNOSIS — M79673 Pain in unspecified foot: Secondary | ICD-10-CM

## 2016-05-05 DIAGNOSIS — M722 Plantar fascial fibromatosis: Secondary | ICD-10-CM

## 2016-05-05 DIAGNOSIS — M21629 Bunionette of unspecified foot: Secondary | ICD-10-CM

## 2016-05-05 MED ORDER — DICLOFENAC SODIUM 75 MG PO TBEC: 75.0000 mg | DELAYED_RELEASE_TABLET | Freq: Two times a day (BID) | ORAL | 0 refills | Status: DC

## 2016-05-05 MED ORDER — BETAMETHASONE SOD PHOS & ACET 6 (3-3) MG/ML IJ SUSP: 12.0000 mg | Freq: Once | INTRAMUSCULAR | Status: AC

## 2016-05-05 NOTE — Progress Notes (Signed)
Subjective: Patient presents today with a new complaint of severe pain on palpation to the fifth MPJ bilaterally. 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 Patient states that in the mornings his feet are tight and it takes a little while for them to warm up in the mornings where he can ambulate.   Objective: Physical Exam General: The patient is alert and oriented x3 in no acute distress.  Dermatology: Skin is warm, dry and supple bilateral lower extremities. Negative for open lesions or macerations.  Vascular: Palpable pedal pulses bilaterally. No edema or erythema noted. Capillary refill within normal limits.  Neurological: Epicritic and protective threshold grossly intact bilaterally.   Musculoskeletal Exam: Significant pain over patient's the fifth MPJs bilaterally. Edema with fluctuance on the lateral aspect of the fifth MPJs bilaterally. Range of motion within normal limits to all pedal and ankle joints bilateral. Muscle strength 5/5 in all groups bilateral.   Radiographic Exam:  Significant tailor's bunion deformity noted bilaterally with an enlargement of the metatarsal heads. End-stage hallux limitus noted to the right first MPJ. Beginning stages hallux limitus to the left MPJ. All other joints look healthy and viable with joint spaces preserved and no degeneration.  Normal osseous mineralization. Joint spaces preserved. No fracture/dislocation/boney destruction.     Assessment:  #1 tailor's bunion deformity bilateral with fifth MPJ capsulitis.  #2 bursitis fifth MPJ bilateral #3 hallux limitus bilateral  #4 pain in feet bilaterally  #5 mild plantar fasciitis right  #6 hammertoe digit deformity 4 and 5 bilateral Problem List Items Addressed This Visit    None    Visit Diagnoses    Foot pain, unspecified laterality    -  Primary   Relevant Orders   DG Foot Complete Left   DG Foot Complete Right        Plan of Care:   #1 Patient was evaluated. #2 Today were going to inject 0.5 mL Celestone Soluspan into the bilateral fifth MPJs. #3 discussed custom orthotics due to the nature of his work, they should benefit him significantly to alleviate symptomatically for pain. His insurance will cover a portion of the orthotics. #4 discussed surgical option for patient. Patient is contemplating surgery patient was instructed to return in one month prior to the date that he wants to have surgery. Recommended surgical date would be a month of December during holidays with his work is slow. #5 return on a when necessary basis     Dr. Edrick Kins, [place]

## 2016-05-23 IMAGING — CR DG CHEST 2V
2 series · 2 of 2 positions shown · non-contrast
Comparison: 12/12/2013

CLINICAL DATA: Left chest pain

EXAM:
CHEST  2 VIEW

[chest pa]
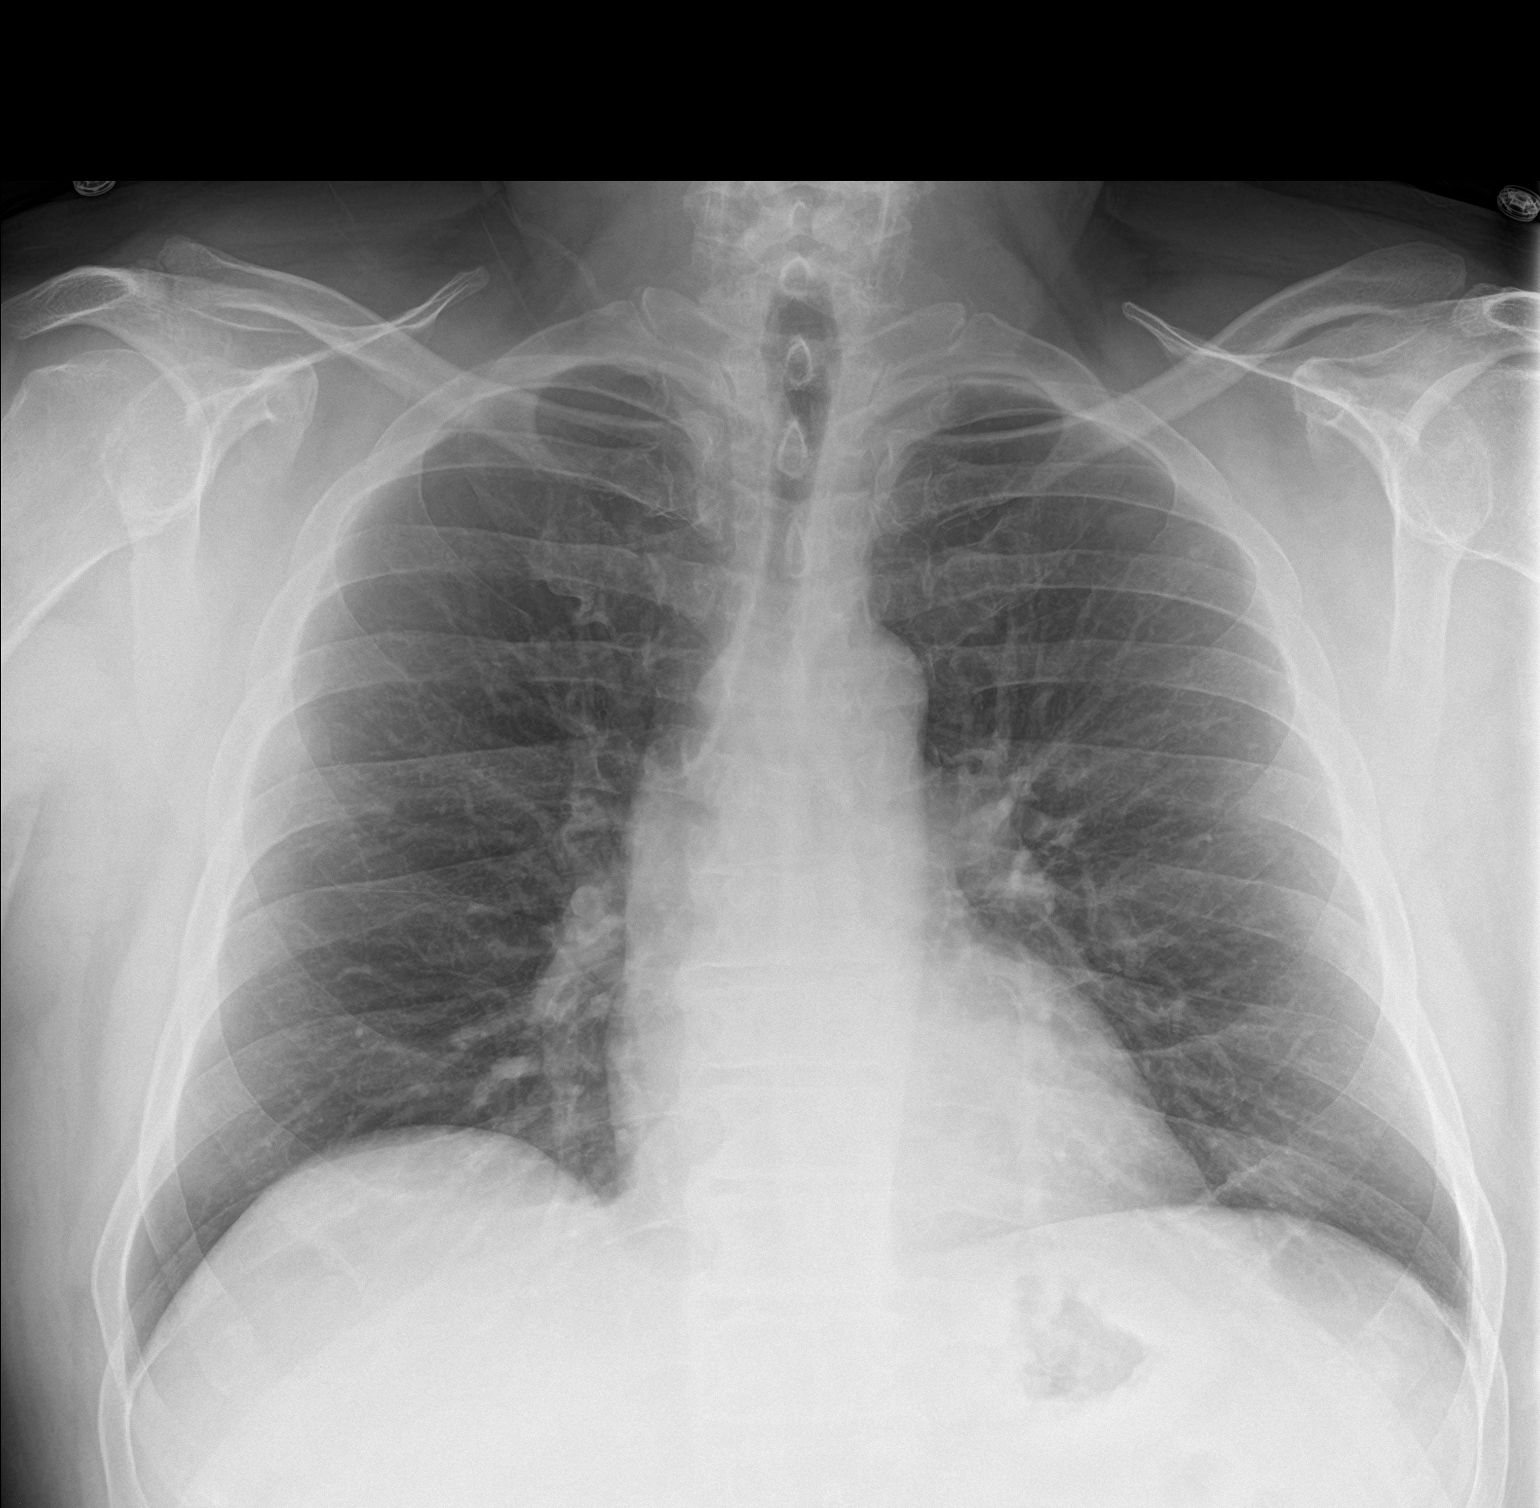

[chest lat]
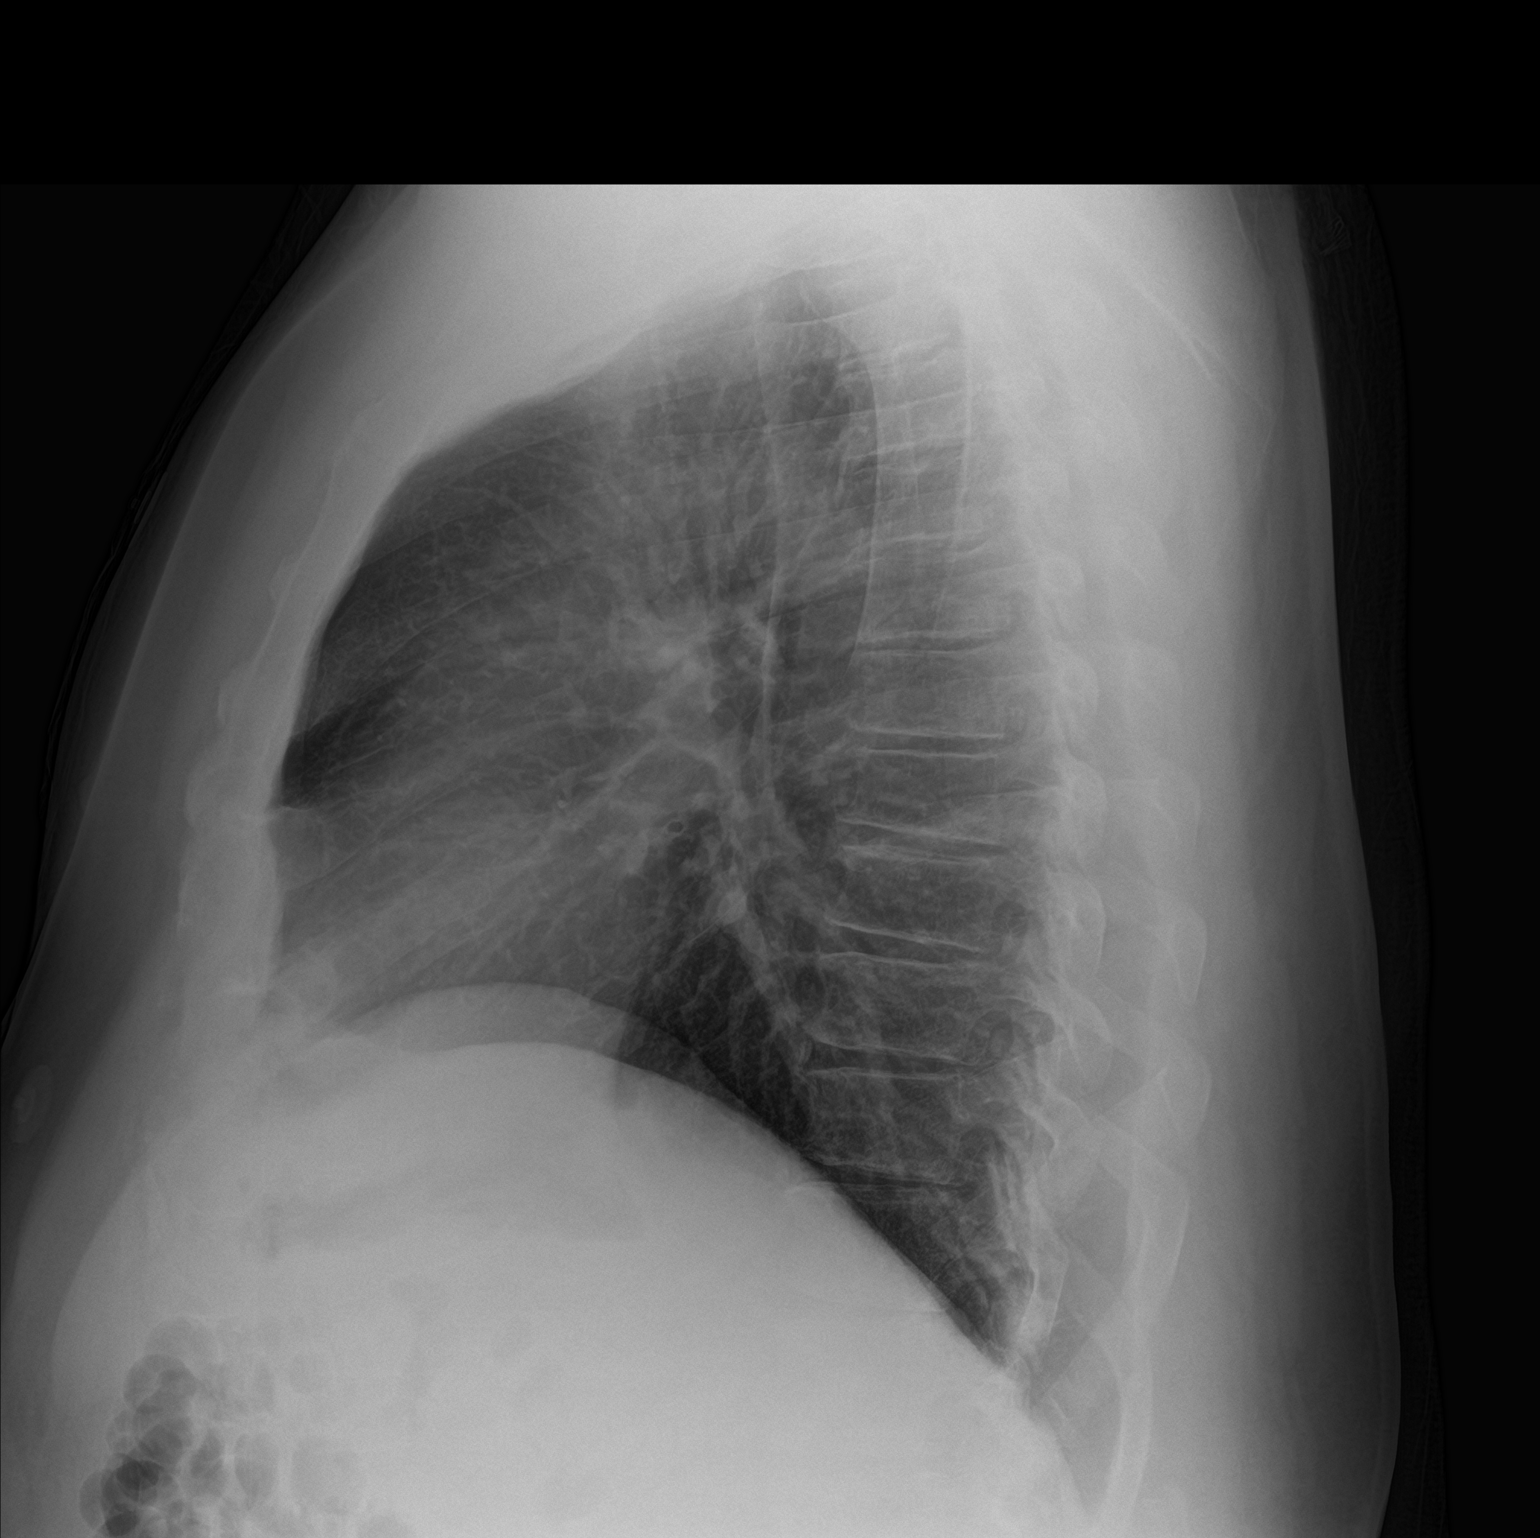

[2 of 2 positions shown; findings below may reference images not displayed]

FINDINGS: Lungs are clear.  No pleural effusion or pneumothorax.

The heart is normal in size.

Visualized osseous structures are within normal limits.
IMPRESSION: No evidence of acute cardiopulmonary disease.

## 2016-06-06 ENCOUNTER — Encounter: Payer: Self-pay | Admitting: Family Medicine

## 2016-06-06 ENCOUNTER — Ambulatory Visit (INDEPENDENT_AMBULATORY_CARE_PROVIDER_SITE_OTHER): Payer: BLUE CROSS/BLUE SHIELD | Admitting: Family Medicine

## 2016-06-06 VITALS — BP 140/88 | HR 109 | Temp 98.2°F | Wt 271.0 lb

## 2016-06-06 DIAGNOSIS — R5382 Chronic fatigue, unspecified: Secondary | ICD-10-CM | POA: Diagnosis not present

## 2016-06-06 DIAGNOSIS — M2550 Pain in unspecified joint: Secondary | ICD-10-CM | POA: Diagnosis not present

## 2016-06-06 NOTE — Progress Notes (Signed)
Subjective:  Patient ID: Samuel Heath, male    DOB: 04-Jan-1961  Age: 55 y.o. MRN: [id number]  CC: Fatigue  HPI:  55 year old male with CAD, alcohol abuse, HTN, HLD presents with complaints of fatigue.  Patient reports a 2 month history of fatigue. No known inciting factor. He states that "I just don't have any energy". Denies depression/feelings of depression. He continues to drink heavily (6-8 beers daily).  No trouble sleeping. No known associated symptoms. No known exacerbating or relieving factors. Moderate to severe. No other complaints today.  Social Hx   Social History   Social History  . Marital status: Married    Spouse name: N/A  . Number of children: N/A  . Years of education: N/A   Occupational History  . Truck Surveyor, mining mobile homes   Social History Main Topics  . Smoking status: Never Smoker  . Smokeless tobacco: Former Systems developer     Comment: Denies tobacco use  . Alcohol use 36.0 oz/week    60 Cans of beer per week     Comment: More than a 12-pack of beer a day.10-20-15 now down to 6 beers /day  . Drug use: No  . Sexual activity: Not Asked   Other Topics Concern  . None   Social History Narrative  . None   Review of Systems  Constitutional: Positive for fatigue.  Respiratory: Negative.   Cardiovascular: Negative.    Objective:  BP 140/88 (BP Location: Right Arm, Patient Position: Sitting, Cuff Size: Large)   Pulse (!) 109   Temp 98.2 F (36.8 C) (Oral)   Wt 271 lb (122.9 kg)   SpO2 95%   BMI 38.88 kg/m   BP/Weight 06/06/2016 02/25/2016 123456  Systolic BP [id number] 0000000 0000000  Diastolic BP 88 86 96  Wt. (Lbs) 271 262 264.25  BMI 38.88 37.59 37.92    Physical Exam  Constitutional: He is oriented to person, place, and time. He appears well-developed. No distress.  Cardiovascular: Regular rhythm.  Tachycardia present.   Pulmonary/Chest: Effort normal. He has no wheezes. He has no rales.  Abdominal: Soft. He exhibits no distension. There is  no tenderness. There is no rebound and no guarding.  Neurological: He is alert and oriented to person, place, and time.  Psychiatric: He has a normal mood and affect.  Vitals reviewed.   Lab Results  Component Value Date   WBC 5.8 01/16/2016   HGB 13.7 01/16/2016   HCT 41.7 01/16/2016   PLT 246 01/16/2016   GLUCOSE 100 (H) 01/16/2016   CHOL 255 (H) 10/01/2015   TRIG 261.0 (H) 10/01/2015   HDL 43.80 10/01/2015   LDLDIRECT 175.0 10/01/2015   LDLCALC (H) 03/07/2009    220        Total Cholesterol/HDL:CHD Risk Coronary Heart Disease Risk Table                     Men   Women  1/2 Average Risk   3.4   3.3  Average Risk       5.0   4.4  2 X Average Risk   9.6   7.1  3 X Average Risk  23.4   11.0        Use the calculated Patient Ratio above and the CHD Risk Table to determine the patient's CHD Risk.        ATP III CLASSIFICATION (LDL):  <100     mg/dL   Optimal  100-129  mg/dL   Near or Above                    Optimal  130-159  mg/dL   Borderline  160-189  mg/dL   High  >190     mg/dL   Very High   ALT 35 10/01/2015   AST 25 10/01/2015   NA 137 01/16/2016   K 4.3 01/16/2016   CL 102 01/16/2016   CREATININE 0.83 01/16/2016   BUN 12 01/16/2016   CO2 26 01/16/2016   TSH 2.26 10/01/2015   INR 1.01 10/20/2015   HGBA1C 5.5 10/01/2015    Assessment & Plan:   Problem List Items Addressed This Visit    Chronic fatigue - Primary    New problem. Unclear etiology/prognosis. Obtaining laboratory work up to assess for underlying organic cause.      Relevant Orders   CBC   Comprehensive metabolic panel   TSH   Testosterone Total,Free,Bio, Males   Vitamin B12    Other Visit Diagnoses    Arthralgia, unspecified joint       Relevant Orders   Uric acid      Follow-up: Based on lab studies  Thersa Salt DO [facility]

## 2016-06-06 NOTE — Assessment & Plan Note (Signed)
New problem. Unclear etiology/prognosis. Obtaining laboratory work up to assess for underlying organic cause.

## 2016-06-06 NOTE — Patient Instructions (Signed)
We will check you labs regarding the fatigue.  We will schedule follow up based on the labs   Take care  Dr. Lacinda Axon

## 2016-06-06 NOTE — Progress Notes (Signed)
Pre visit review using our clinic review tool, if applicable. No additional management support is needed unless otherwise documented below in the visit note. 

## 2016-06-13 ENCOUNTER — Other Ambulatory Visit: Payer: BLUE CROSS/BLUE SHIELD

## 2016-06-16 ENCOUNTER — Other Ambulatory Visit (INDEPENDENT_AMBULATORY_CARE_PROVIDER_SITE_OTHER): Payer: BLUE CROSS/BLUE SHIELD

## 2016-06-16 DIAGNOSIS — M2550 Pain in unspecified joint: Secondary | ICD-10-CM

## 2016-06-16 DIAGNOSIS — R5382 Chronic fatigue, unspecified: Secondary | ICD-10-CM

## 2016-06-16 LAB — COMPREHENSIVE METABOLIC PANEL
ALT: 39 U/L (ref 0–53)
AST: 30 U/L (ref 0–37)
Albumin: 4.3 g/dL (ref 3.5–5.2)
Alkaline Phosphatase: 64 U/L (ref 39–117)
BUN: 14 mg/dL (ref 6–23)
CO2: 29 mEq/L (ref 19–32)
Calcium: 9.3 mg/dL (ref 8.4–10.5)
Chloride: 99 mEq/L (ref 96–112)
Creatinine, Ser: 0.89 mg/dL (ref 0.40–1.50)
GFR: 93.92 mL/min (ref >60.00)
Glucose, Bld: 149 mg/dL — ABNORMAL HIGH (ref 70–99)
Potassium: 4.6 mEq/L (ref 3.5–5.1)
Sodium: 135 mEq/L (ref 135–145)
Total Bilirubin: 0.5 mg/dL (ref 0.2–1.2)
Total Protein: 6.9 g/dL (ref 6.0–8.3)

## 2016-06-16 LAB — CBC
HCT: 43 % (ref 39.0–52.0)
Hemoglobin: 14.6 g/dL (ref 13.0–17.0)
MCHC: 34 g/dL (ref 30.0–36.0)
MCV: 97.2 fl (ref 78.0–100.0)
Platelets: 268 10*3/uL (ref 150.0–400.0)
RBC: 4.42 Mil/uL (ref 4.22–5.81)
RDW: 13.5 % (ref 11.5–15.5)
WBC: 4.9 10*3/uL (ref 4.0–10.5)

## 2016-06-16 LAB — URIC ACID: Uric Acid, Serum: 7.6 mg/dL (ref 4.0–7.8)

## 2016-06-16 LAB — TSH: TSH: 1.7 u[IU]/mL (ref 0.35–4.50)

## 2016-06-16 LAB — VITAMIN B12: Vitamin B-12: 978 pg/mL — ABNORMAL HIGH (ref 211–911)

## 2016-06-19 LAB — TESTOSTERONE TOTAL,FREE,BIO, MALES
Albumin: 4.1 g/dL (ref 3.6–5.1)
Sex Hormone Binding: 22 nmol/L (ref 22–77)
Testosterone: 140 ng/dL — ABNORMAL LOW (ref 250–827)

## 2016-06-26 ENCOUNTER — Other Ambulatory Visit (INDEPENDENT_AMBULATORY_CARE_PROVIDER_SITE_OTHER): Payer: BLUE CROSS/BLUE SHIELD

## 2016-06-26 ENCOUNTER — Other Ambulatory Visit: Payer: Self-pay | Admitting: Family Medicine

## 2016-06-26 DIAGNOSIS — E349 Endocrine disorder, unspecified: Secondary | ICD-10-CM | POA: Diagnosis not present

## 2016-06-26 DIAGNOSIS — R7989 Other specified abnormal findings of blood chemistry: Secondary | ICD-10-CM

## 2016-06-27 LAB — TESTOSTERONE TOTAL,FREE,BIO, MALES
Albumin: 4.3 g/dL (ref 3.6–5.1)
Sex Hormone Binding: 24 nmol/L (ref 22–77)
Testosterone: 222 ng/dL — ABNORMAL LOW (ref 250–827)

## 2016-07-03 ENCOUNTER — Ambulatory Visit (INDEPENDENT_AMBULATORY_CARE_PROVIDER_SITE_OTHER): Payer: BLUE CROSS/BLUE SHIELD | Admitting: Podiatry

## 2016-07-03 VITALS — BP 121/85 | HR 102 | Resp 16

## 2016-07-03 DIAGNOSIS — M7751 Other enthesopathy of right foot: Secondary | ICD-10-CM

## 2016-07-03 DIAGNOSIS — Q828 Other specified congenital malformations of skin: Secondary | ICD-10-CM | POA: Diagnosis not present

## 2016-07-03 DIAGNOSIS — M7752 Other enthesopathy of left foot: Secondary | ICD-10-CM

## 2016-07-03 NOTE — Progress Notes (Signed)
He presents today with a chief complaint of pain to the fifth metatarsophalangeal joints bilaterally. He states that the last injection C received did not work. He said that there was no debridement taking place.  Objective: Vital signs that was alert and oriented 3. He has bursitis and capsulitis of the fifth metatarsophalangeal joints bilateral tailor's bunion deformities but overlying reactive hyperkeratotic or porokeratotic lesion is also present. I see no signs of infection only inflammation at this point. No cellulitis drainage or odor. No open lesions or wounds are noted.  Assessment: Bursitis capsulitis fifth metatarsophalangeal joints overlying porokeratosis.  Plan: I injected dexamethasone and local anesthetic beneath the lesions today in debridement then thoroughly this should alleviate his symptoms quite nicely. We did discuss once again the necessity for surgical intervention.

## 2016-07-05 ENCOUNTER — Other Ambulatory Visit: Payer: Self-pay | Admitting: Family Medicine

## 2016-07-05 ENCOUNTER — Telehealth: Payer: Self-pay | Admitting: Family Medicine

## 2016-07-05 DIAGNOSIS — R7989 Other specified abnormal findings of blood chemistry: Secondary | ICD-10-CM

## 2016-07-05 NOTE — Telephone Encounter (Signed)
Please place urologist referral.

## 2016-07-05 NOTE — Telephone Encounter (Signed)
Pt returned office call. He thinks it is to ok a Medtronic. He says go ahead and schedule.

## 2016-08-14 ENCOUNTER — Ambulatory Visit: Payer: BLUE CROSS/BLUE SHIELD | Admitting: Podiatry

## 2016-08-16 ENCOUNTER — Telehealth: Payer: Self-pay | Admitting: Cardiovascular Disease

## 2016-08-16 NOTE — Telephone Encounter (Signed)
3 attempts to schedule fu from recall list. lmov .  Deleting recall.

## 2016-09-06 ENCOUNTER — Ambulatory Visit (INDEPENDENT_AMBULATORY_CARE_PROVIDER_SITE_OTHER): Payer: BLUE CROSS/BLUE SHIELD | Admitting: Podiatry

## 2016-09-06 DIAGNOSIS — M7751 Other enthesopathy of right foot: Secondary | ICD-10-CM

## 2016-09-06 DIAGNOSIS — M779 Enthesopathy, unspecified: Secondary | ICD-10-CM | POA: Diagnosis not present

## 2016-09-06 DIAGNOSIS — M7752 Other enthesopathy of left foot: Secondary | ICD-10-CM

## 2016-09-06 DIAGNOSIS — Q828 Other specified congenital malformations of skin: Secondary | ICD-10-CM | POA: Diagnosis not present

## 2016-09-06 DIAGNOSIS — M21629 Bunionette of unspecified foot: Secondary | ICD-10-CM | POA: Diagnosis not present

## 2016-09-06 NOTE — Progress Notes (Signed)
Samuel Heath presents today for follow-up of painful porokeratotic lesions to the fifth metatarsophalangeal joints bilateral and pain on palpation and ambulation. He relates he purchased a new pair of boots. He states that these are wide enough and he just got them 3 weeks ago.  Objective: Vital signs are stable he is alert and oriented 3. His pain on range of motion and palpation of the fifth metatarsophalangeal joints bilateral with overlying boggy sensation indicative of bursitis. Porokeratotic lesions fifth metatarsophalangeal joints plantar and lateral bilateral.  Assessment: Bursitis capsulitis fifth metatarsophalangeal was bilateral porokeratosis. No open lesions or wounds.  Plan: Debridement of reactive hyperkeratotic lesion and also injected dexamethasone to the joints bilateral sterile Betadine skin prep. Follow up with him on an as-needed basis.

## 2016-11-06 ENCOUNTER — Ambulatory Visit (INDEPENDENT_AMBULATORY_CARE_PROVIDER_SITE_OTHER): Payer: BLUE CROSS/BLUE SHIELD | Admitting: Podiatry

## 2016-11-06 DIAGNOSIS — M779 Enthesopathy, unspecified: Secondary | ICD-10-CM | POA: Diagnosis not present

## 2016-11-06 DIAGNOSIS — M21629 Bunionette of unspecified foot: Secondary | ICD-10-CM

## 2016-11-06 NOTE — Progress Notes (Signed)
He presents today with chief complaint of painful tailor's bunion deformities and capsulitis with bursitis.  Objective: Vital signs are stable he is alert and oriented 3. Pulses are palpable. Neurologic sensation was intact. Degenerative flexor intact. Muscle strength is 5 over 5 dorsiflexion plantar flexors and inverters everters on his musculature is intact. He has pain on palpation of the bursa overlying the tailor's bunion deformities bilateral.  Assessment: Bursitis capsulitis fifth metatarsophalangeal joints with tailor's bunion deformities.  Plan: I injected areas today with dexamethasone and local anesthetic follow-up with him on an as-needed basis surgical intervention.

## 2016-11-19 ENCOUNTER — Other Ambulatory Visit: Payer: Self-pay | Admitting: Family Medicine

## 2017-01-08 ENCOUNTER — Encounter: Payer: Self-pay | Admitting: Podiatry

## 2017-01-08 ENCOUNTER — Ambulatory Visit (INDEPENDENT_AMBULATORY_CARE_PROVIDER_SITE_OTHER): Payer: BLUE CROSS/BLUE SHIELD | Admitting: Podiatry

## 2017-01-08 DIAGNOSIS — M7752 Other enthesopathy of left foot: Secondary | ICD-10-CM | POA: Diagnosis not present

## 2017-01-08 DIAGNOSIS — M7751 Other enthesopathy of right foot: Secondary | ICD-10-CM | POA: Diagnosis not present

## 2017-01-08 DIAGNOSIS — Q828 Other specified congenital malformations of skin: Secondary | ICD-10-CM

## 2017-01-08 NOTE — Progress Notes (Signed)
Samuel Heath presents today for follow-up of his painful fifth metatarsophalangeal joints and capsulitis with poor keratomas bilaterally. He states that he is unable to go through surgery the would correct this at this point in time. He asks me also not to trim the areas of hyperkeratosis that he will does want injections of dexamethasone Alleviate his symptoms.  Objective: Vital signs are stable he is alert and oriented 3. Pulses are strongly palpable. Neurologic sensorium is intact. No open lesions or wounds are noted painful areas overlying the plantar lateral aspects of the fifth metatarsophalangeal joints for bursitis is most likely the culprit with capsulitis.  Assessment: Capsulitis bursitis for keratomas bilateral fifth metatarsophalangeal joint.  Plan: Injected these areas today with dexamethasone and local anesthetic to help alleviate symptoms. I will follow-up with him in 2 months. May need to consider orthotics.

## 2017-03-12 ENCOUNTER — Encounter (INDEPENDENT_AMBULATORY_CARE_PROVIDER_SITE_OTHER): Payer: BLUE CROSS/BLUE SHIELD | Admitting: Podiatry

## 2017-03-12 NOTE — Progress Notes (Signed)
This encounter was created in error - please disregard.

## 2017-04-05 ENCOUNTER — Telehealth: Payer: Self-pay | Admitting: Family Medicine

## 2017-04-05 NOTE — Telephone Encounter (Signed)
Yes, needs a new patient appointment so I can review his medical conditions. He's due for some lab work for which I'd like to do that day. Please schedule his appointment at his convenience and have him come fasting for 4-8 hours prior to his visit with me.

## 2017-04-05 NOTE — Telephone Encounter (Signed)
Spouse called wanting to transfer to you .  She stated dr Lacinda Axon is leaving [place].  Ok to transfer  Next new patient appointment??

## 2017-04-24 NOTE — Telephone Encounter (Signed)
Appointment 9/13 spouse aware

## 2017-05-09 ENCOUNTER — Encounter: Payer: Self-pay | Admitting: Podiatry

## 2017-05-09 ENCOUNTER — Ambulatory Visit (INDEPENDENT_AMBULATORY_CARE_PROVIDER_SITE_OTHER): Payer: BLUE CROSS/BLUE SHIELD | Admitting: Podiatry

## 2017-05-09 ENCOUNTER — Telehealth: Payer: Self-pay | Admitting: *Deleted

## 2017-05-09 DIAGNOSIS — M7752 Other enthesopathy of left foot: Secondary | ICD-10-CM | POA: Diagnosis not present

## 2017-05-09 DIAGNOSIS — M7751 Other enthesopathy of right foot: Secondary | ICD-10-CM

## 2017-05-09 DIAGNOSIS — M21622 Bunionette of left foot: Secondary | ICD-10-CM | POA: Diagnosis not present

## 2017-05-09 DIAGNOSIS — M779 Enthesopathy, unspecified: Secondary | ICD-10-CM

## 2017-05-09 DIAGNOSIS — M21621 Bunionette of right foot: Secondary | ICD-10-CM | POA: Diagnosis not present

## 2017-05-09 NOTE — Telephone Encounter (Signed)
"  I'm calling to schedule an appointment for surgery on my foot."

## 2017-05-09 NOTE — Patient Instructions (Signed)
Pre-Operative Instructions  Congratulations, you have decided to take an important step towards improving your quality of life.  You can be assured that the doctors and staff at [facility] will be with you every step of the way.  Here are some important things you should know:  1. Plan to be at the surgery center/hospital at least 1 (one) hour prior to your scheduled time, unless otherwise directed by the surgical center/hospital staff.  You must have a responsible adult accompany you, remain during the surgery and drive you home.  Make sure you have directions to the surgical center/hospital to ensure you arrive on time. 2. If you are having surgery at [facility] or [facility] hospitals, you will need a copy of your medical history and physical form from your family physician within one month prior to the date of surgery. We will give you a form for your primary physician to complete.  3. We make every effort to accommodate the date you request for surgery.  However, there are times where surgery dates or times have to be moved.  We will contact you as soon as possible if a change in schedule is required.   4. No aspirin/ibuprofen for one week before surgery.  If you are on aspirin, any non-steroidal anti-inflammatory medications (Mobic, Aleve, Ibuprofen) should not be taken seven (7) days prior to your surgery.  You make take Tylenol for pain prior to surgery.  5. Medications - If you are taking daily heart and blood pressure medications, seizure, reflux, allergy, asthma, anxiety, pain or diabetes medications, make sure you notify the surgery center/hospital before the day of surgery so they can tell you which medications you should take or avoid the day of surgery. 6. No food or drink after midnight the night before surgery unless directed otherwise by surgical center/hospital staff. 7. No alcoholic beverages 24-hours prior to surgery.  No smoking 24-hours prior or 24-hours after  surgery. 8. Wear loose pants or shorts. They should be loose enough to fit over bandages, boots, and casts. 9. Don't wear slip-on shoes. Sneakers are preferred. 10. Bring your boot with you to the surgery center/hospital.  Also bring crutches or a walker if your physician has prescribed it for you.  If you do not have this equipment, it will be provided for you after surgery. 11. If you have not been contacted by the surgery center/hospital by the day before your surgery, call to confirm the date and time of your surgery. 12. Leave-time from work may vary depending on the type of surgery you have.  Appropriate arrangements should be made prior to surgery with your employer. 13. Prescriptions will be provided immediately following surgery by your doctor.  Fill these as soon as possible after surgery and take the medication as directed. Pain medications will not be refilled on weekends and must be approved by the doctor. 14. Remove nail polish on the operative foot and avoid getting pedicures prior to surgery. 15. Wash the night before surgery.  The night before surgery wash the foot and leg well with water and the antibacterial soap provided. Be sure to pay special attention to beneath the toenails and in between the toes.  Wash for at least three (3) minutes. Rinse thoroughly with water and dry well with a towel.  Perform this wash unless told not to do so by your physician.  Enclosed: 1 Ice pack (please put in freezer the night before surgery)   1 Hibiclens skin cleaner     Pre-op instructions  If you have any questions regarding the instructions, please do not hesitate to call our office.  [facility]: [address], [place], [place] [address] -- [phone]  [place]: [address] [place], [place] [address] -- [phone]  [place]: [address]  [place], [place] [address] -- [phone]  [place]: [address], [address], [place],  [address] -- [phone]  Website:  [email or web address] 

## 2017-05-09 NOTE — Progress Notes (Signed)
Mr. Kimple presents today for follow-up of capsulitis bilateral feet he states that on radiographs have surgery done. He relates that his health is good no complications he understands that he will be off his feet for a period time and understands this is amenable to it.  Objective: Vital signs are stable he's alert and oriented 3. He has severe tailor bunion deformity with overlying reactive hyperkeratosis. Bursitis and capsulitis is evident.  Assessment: Pain limb secondary to bunion deformity bursitis capsulitis and porokeratosis.  Plan: Discussed etiology pathology concerned versus surgical therapies at this point we performed injections around the joint today with Kenalog and local anesthetic. Also performed consent signing for a fifth metatarsal osteotomy bilateral foot with screws. He understands this is amenable to it is an occupational consent form. We did discuss possible complications which may include but are not limited to pain bleeding swelling infection recurrence need further surgery delayed union delayed healing. He understands this and is now for follow-up with him in the near future for surgery.

## 2017-05-10 ENCOUNTER — Encounter: Payer: Self-pay | Admitting: Primary Care

## 2017-05-10 ENCOUNTER — Ambulatory Visit (INDEPENDENT_AMBULATORY_CARE_PROVIDER_SITE_OTHER): Payer: BLUE CROSS/BLUE SHIELD | Admitting: Primary Care

## 2017-05-10 VITALS — BP 124/82 | HR 87 | Temp 98.6°F | Ht 70.0 in | Wt 278.8 lb

## 2017-05-10 DIAGNOSIS — I2510 Atherosclerotic heart disease of native coronary artery without angina pectoris: Secondary | ICD-10-CM | POA: Diagnosis not present

## 2017-05-10 DIAGNOSIS — I10 Essential (primary) hypertension: Secondary | ICD-10-CM | POA: Diagnosis not present

## 2017-05-10 DIAGNOSIS — E785 Hyperlipidemia, unspecified: Secondary | ICD-10-CM

## 2017-05-10 DIAGNOSIS — F1010 Alcohol abuse, uncomplicated: Secondary | ICD-10-CM | POA: Diagnosis not present

## 2017-05-10 DIAGNOSIS — R5382 Chronic fatigue, unspecified: Secondary | ICD-10-CM | POA: Diagnosis not present

## 2017-05-10 LAB — COMPREHENSIVE METABOLIC PANEL
ALT: 35 U/L (ref 0–53)
AST: 25 U/L (ref 0–37)
Albumin: 4 g/dL (ref 3.5–5.2)
Alkaline Phosphatase: 59 U/L (ref 39–117)
BUN: 13 mg/dL (ref 6–23)
CO2: 30 mEq/L (ref 19–32)
Calcium: 9.3 mg/dL (ref 8.4–10.5)
Chloride: 103 mEq/L (ref 96–112)
Creatinine, Ser: 1.05 mg/dL (ref 0.40–1.50)
GFR: 77.36 mL/min (ref >60.00)
Glucose, Bld: 105 mg/dL — ABNORMAL HIGH (ref 70–99)
Potassium: 4.4 mEq/L (ref 3.5–5.1)
Sodium: 140 mEq/L (ref 135–145)
Total Bilirubin: 0.4 mg/dL (ref 0.2–1.2)
Total Protein: 6.8 g/dL (ref 6.0–8.3)

## 2017-05-10 LAB — LIPID PANEL
Cholesterol: 203 mg/dL — ABNORMAL HIGH (ref 0–200)
HDL: 64.8 mg/dL (ref >39.00)
LDL Cholesterol: 115 mg/dL — ABNORMAL HIGH (ref 0–99)
NonHDL: 137.99
Total CHOL/HDL Ratio: 3
Triglycerides: 116 mg/dL (ref 0.0–149.0)
VLDL: 23.2 mg/dL (ref 0.0–40.0)

## 2017-05-10 LAB — HEMOGLOBIN A1C: Hgb A1c MFr Bld: 6 % (ref 4.6–6.5)

## 2017-05-10 LAB — VITAMIN B12: Vitamin B-12: 934 pg/mL — ABNORMAL HIGH (ref 211–911)

## 2017-05-10 MED ORDER — PRAVASTATIN SODIUM 40 MG PO TABS: 40.0000 mg | ORAL_TABLET | Freq: Every day | ORAL | 3 refills | Status: DC

## 2017-05-10 MED ORDER — LISINOPRIL-HYDROCHLOROTHIAZIDE 20-12.5 MG PO TABS: 1.0000 | ORAL_TABLET | Freq: Every day | ORAL | 3 refills | Status: DC

## 2017-05-10 NOTE — Assessment & Plan Note (Signed)
Stable in the office today. Continue lisinopril-HCTZ. BMP pending.

## 2017-05-10 NOTE — Assessment & Plan Note (Signed)
Drinking 12, 12 ounce cans of beer daily. Discussed to reduce consumption dramatically.

## 2017-05-10 NOTE — Patient Instructions (Signed)
Complete lab work prior to leaving today. I will notify you of your results once received.   I sent refills of your medications to your pharmacy.  Start exercising. You should be getting 150 minutes of exercise weekly, start with slow walking.  Reduce sugary drinks, alcohol. Increase vegetables, fruit, whole grains.  Ensure you are consuming 64 ounces of water daily.  It was a pleasure to meet you today! Please don't hesitate to call me with any questions. Welcome to [facility]!
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
## 2017-05-10 NOTE — Assessment & Plan Note (Signed)
Lipid panel pending. Refills for pravastatin sent to pharmacy. Discussed the importance of a healthy diet and regular exercise in order for weight loss, and to reduce the risk of other medical problems.

## 2017-05-10 NOTE — Assessment & Plan Note (Signed)
Taking B 12 daily with improvement. Recent work up unremarkable. Repeat B 12 today.

## 2017-05-10 NOTE — Assessment & Plan Note (Signed)
Records reviewed, appears he did undergo catheterization in 2010. Stress test in 2016 unremarkable. Continue lipid and BP control. Strongly recommended he reduce alcohol consumption and start improving his diet.

## 2017-05-11 NOTE — Telephone Encounter (Signed)
I attempted to call the patient.  I left him a message to call me back. 

## 2017-05-14 NOTE — Telephone Encounter (Signed)
"  We've been playing phone tag."  Yes, Dr. Milinda Pointer does surgery on Fridays.  Do you have a date in mind that you would like to do it?  Yes, I'd like to do it the first of next month."  He can do it on October 5 or 12.  "Let's do it on October 12, that way, I can take care of some stuff."  I'll get it scheduled.  You should hear from someone from the surgical center a day or two prior to the surgery date with the arrival time.  You can register with the surgical center online, instructions are in the brochure that was given to you.

## 2017-05-18 ENCOUNTER — Encounter: Payer: Self-pay | Admitting: *Deleted

## 2017-06-06 ENCOUNTER — Other Ambulatory Visit: Payer: Self-pay | Admitting: Podiatry

## 2017-06-06 MED ORDER — PROMETHAZINE HCL 25 MG PO TABS: 25.0000 mg | ORAL_TABLET | Freq: Three times a day (TID) | ORAL | 0 refills | Status: DC | PRN

## 2017-06-06 MED ORDER — CEPHALEXIN 500 MG PO CAPS: 500.0000 mg | ORAL_CAPSULE | Freq: Three times a day (TID) | ORAL | 0 refills | Status: DC

## 2017-06-06 MED ORDER — OXYCODONE-ACETAMINOPHEN 10-325 MG PO TABS: 1.0000 | ORAL_TABLET | ORAL | 0 refills | Status: DC | PRN

## 2017-06-08 ENCOUNTER — Telehealth: Payer: Self-pay | Admitting: *Deleted

## 2017-06-08 ENCOUNTER — Encounter: Payer: Self-pay | Admitting: Podiatry

## 2017-06-08 NOTE — Telephone Encounter (Signed)
"  I'm calling to see when I my surgery has been rescheduled to."  He can do it on October 18, Thursday.  Is that date okay with you?  Yes, that will be fine."  Someone will call you with the arrival time.

## 2017-06-13 ENCOUNTER — Encounter: Payer: BLUE CROSS/BLUE SHIELD | Admitting: Podiatry

## 2017-06-14 DIAGNOSIS — M21541 Acquired clubfoot, right foot: Secondary | ICD-10-CM | POA: Diagnosis not present

## 2017-06-14 DIAGNOSIS — M21542 Acquired clubfoot, left foot: Secondary | ICD-10-CM | POA: Diagnosis not present

## 2017-06-14 DIAGNOSIS — D492 Neoplasm of unspecified behavior of bone, soft tissue, and skin: Secondary | ICD-10-CM | POA: Diagnosis not present

## 2017-06-20 ENCOUNTER — Encounter: Payer: BLUE CROSS/BLUE SHIELD | Admitting: Podiatry

## 2017-06-20 ENCOUNTER — Ambulatory Visit (INDEPENDENT_AMBULATORY_CARE_PROVIDER_SITE_OTHER): Payer: BLUE CROSS/BLUE SHIELD | Admitting: Podiatry

## 2017-06-20 ENCOUNTER — Ambulatory Visit (INDEPENDENT_AMBULATORY_CARE_PROVIDER_SITE_OTHER): Payer: BLUE CROSS/BLUE SHIELD

## 2017-06-20 ENCOUNTER — Encounter: Payer: Self-pay | Admitting: Podiatry

## 2017-06-20 VITALS — BP 135/77 | HR 113 | Resp 16

## 2017-06-20 DIAGNOSIS — M21621 Bunionette of right foot: Secondary | ICD-10-CM | POA: Diagnosis not present

## 2017-06-20 DIAGNOSIS — M21622 Bunionette of left foot: Secondary | ICD-10-CM

## 2017-06-20 NOTE — Progress Notes (Signed)
Presents today for first postop visit status post fifth metatarsal osteotomies with double screw fixation bilateral area excision small soft tissue lesion. States that he's been doing very well than his left was been sore.  Objective: Vital signs are stable alert and oriented 3. Pulses are palpable. Dry sterile dressing intact was removed demonstrates sutures are intact for as well coapted mild erythema mild ecchymosis no cellulitis or drainage just mild edema.. Radiographs taken today demonstrate well-positioned O osteotomies with screw fixation.  Assessment: Healing surgical fifth metatarsal osteotomies.  Plan: Redressed today dressing compressive dressing. He will continue to use Darco shoes follow up with me in 1-2 weeks

## 2017-06-27 ENCOUNTER — Encounter: Payer: Self-pay | Admitting: Podiatry

## 2017-06-27 ENCOUNTER — Ambulatory Visit (INDEPENDENT_AMBULATORY_CARE_PROVIDER_SITE_OTHER): Payer: BLUE CROSS/BLUE SHIELD | Admitting: Podiatry

## 2017-06-27 DIAGNOSIS — M21621 Bunionette of right foot: Secondary | ICD-10-CM

## 2017-06-27 DIAGNOSIS — M21622 Bunionette of left foot: Secondary | ICD-10-CM

## 2017-06-27 NOTE — Progress Notes (Signed)
DOS 10.12.18 Lt 5th met osteotomy both feet w screws

## 2017-06-30 NOTE — Progress Notes (Signed)
He presents today for his second postop visit. Date of surgery is 06/14/2017. He is status post fifth metatarsal osteotomies bilateral. He states that his feet are doing good and feel much better than he did prior to surgery. He gets some stinging occasionally but for the most part he is happy with the outcome.  Objective: Vital signs are stable he is alert and oriented 3. Pulses are palpable. Considerable edema around the surgical sites more than likely secondary to noncompliance and too much ambulation. His wife confirms this. Sutures are intact margins are the most part coapted however I would feel better leaving the sutures in for another week.  Assessment: Well healing fifth metatarsal osteotomies.  Plan: Redressed him today and I will follow-up with him in 1 week for suture removal. I encouraged him to stay off of his feet as much as possible to allow optimal healing and to decrease the swelling.

## 2017-07-04 ENCOUNTER — Ambulatory Visit (INDEPENDENT_AMBULATORY_CARE_PROVIDER_SITE_OTHER): Payer: BLUE CROSS/BLUE SHIELD | Admitting: Podiatry

## 2017-07-04 ENCOUNTER — Encounter: Payer: Self-pay | Admitting: Podiatry

## 2017-07-04 DIAGNOSIS — M21621 Bunionette of right foot: Secondary | ICD-10-CM

## 2017-07-04 DIAGNOSIS — M21622 Bunionette of left foot: Secondary | ICD-10-CM

## 2017-07-04 NOTE — Progress Notes (Signed)
Mr. Kucinski presents today for follow-up of his fifth metatarsal osteotomies. He states that he feels great.  Objective: Vital signs temp is alert and oriented 3 sutures of the heel uneventfully he has no pain on palpation of fifth metatarsal osteotomy sites. There is still mild edema present. No signs of infection.  Assessment: Well-healing surgical feet 3 weeks out status post fifth metatarsal osteotomies.  Plan: Removed all sutures today from the compression anklet somewhat Back into a stiff soled boot.

## 2017-07-25 ENCOUNTER — Encounter: Payer: Self-pay | Admitting: Podiatry

## 2017-07-25 ENCOUNTER — Ambulatory Visit (INDEPENDENT_AMBULATORY_CARE_PROVIDER_SITE_OTHER): Payer: BLUE CROSS/BLUE SHIELD | Admitting: Podiatry

## 2017-07-25 ENCOUNTER — Ambulatory Visit (INDEPENDENT_AMBULATORY_CARE_PROVIDER_SITE_OTHER): Payer: BLUE CROSS/BLUE SHIELD

## 2017-07-25 DIAGNOSIS — M21622 Bunionette of left foot: Secondary | ICD-10-CM

## 2017-07-25 DIAGNOSIS — M21621 Bunionette of right foot: Secondary | ICD-10-CM

## 2017-07-25 NOTE — Progress Notes (Signed)
He presents today status post fifth metatarsal osteotomies bilateral. He states they're doing very well some soreness particularly the left but doing much better than they were. His wife states that he's been very busy even wearing his boots recently and not staying off of his feet.  Objective: Vital signs are stable he is alert and oriented 3. Pulses are palpable. Fifth metatarsal area still somewhat swollen and tender. Radiographs demonstrate fracture sites of the osteotomies that appear to be healing by secondary intent.  Assessment: Fifth metatarsal osteotomy is better fractured but healing.  Plan: Follow up with me in 1 month and let him get back into his regular boots.

## 2017-08-28 HISTORY — PX: FOOT SURGERY: SHX648

## 2017-08-29 ENCOUNTER — Ambulatory Visit (INDEPENDENT_AMBULATORY_CARE_PROVIDER_SITE_OTHER): Payer: BLUE CROSS/BLUE SHIELD

## 2017-08-29 ENCOUNTER — Ambulatory Visit (INDEPENDENT_AMBULATORY_CARE_PROVIDER_SITE_OTHER): Payer: BLUE CROSS/BLUE SHIELD | Admitting: Podiatry

## 2017-08-29 ENCOUNTER — Encounter: Payer: Self-pay | Admitting: Podiatry

## 2017-08-29 DIAGNOSIS — M21622 Bunionette of left foot: Secondary | ICD-10-CM

## 2017-08-29 DIAGNOSIS — M21621 Bunionette of right foot: Secondary | ICD-10-CM

## 2017-08-29 NOTE — Progress Notes (Signed)
He presents today for follow-up of his fifth metatarsal osteotomies bilateral foot.  Date of surgery June 14, 2017.  States that they are doing good I have some burning pain occasionally but that is it.  Objective: Vital signs are stable he is alert and oriented x3 he has no pain on palpation of the fifth metatarsal heads bilateral.  Swelling is going down considerably.  Radiographs today demonstrate a hypertrophic bone healing where he has had fracture and dislocation of the capital fragments.  These appear to be healing nonetheless.  Assessment: Fifth metatarsal osteotomies.  Plan: Follow-up with me in 1 month I am going to allow him to get back into his regular tennis shoes.

## 2017-10-10 ENCOUNTER — Encounter: Payer: BLUE CROSS/BLUE SHIELD | Admitting: Podiatry

## 2017-10-24 ENCOUNTER — Encounter: Payer: BLUE CROSS/BLUE SHIELD | Admitting: Podiatry

## 2017-11-12 ENCOUNTER — Encounter: Payer: Self-pay | Admitting: Podiatry

## 2017-11-12 ENCOUNTER — Ambulatory Visit (INDEPENDENT_AMBULATORY_CARE_PROVIDER_SITE_OTHER): Payer: BLUE CROSS/BLUE SHIELD | Admitting: Podiatry

## 2017-11-12 DIAGNOSIS — M21622 Bunionette of left foot: Secondary | ICD-10-CM

## 2017-11-12 DIAGNOSIS — M21621 Bunionette of right foot: Secondary | ICD-10-CM

## 2017-11-12 NOTE — Progress Notes (Signed)
Presents today for his final postop visit date of surgery 06/14/2017.  Fifth metatarsal osteotomies bilateral are healing very nicely he states the foot gets a little sore every once in a while but for the most part it is 100% better than it was prior to surgery.  Objective: Vital signs are stable he is alert and oriented x3.  He denies fever chills nausea vomiting muscle aches and pains.  Pulses remain strongly palpable.  Neurologic sensorium is intact.  Deep tendon reflexes are intact.  Muscle strength is normal symmetrical bilateral.  No pain on palpation fifth metatarsals bilateral.  Refused radiographs today.  Assessment: Well-healing surgical foot bilateral.  Assessment: Follow-up with me on an as-needed basis.

## 2018-05-11 ENCOUNTER — Other Ambulatory Visit: Payer: Self-pay | Admitting: Primary Care

## 2018-05-11 DIAGNOSIS — I10 Essential (primary) hypertension: Secondary | ICD-10-CM

## 2018-05-23 ENCOUNTER — Encounter: Payer: Self-pay | Admitting: Primary Care

## 2018-05-23 ENCOUNTER — Ambulatory Visit: Payer: BLUE CROSS/BLUE SHIELD | Admitting: Primary Care

## 2018-05-23 VITALS — BP 146/82 | HR 89 | Temp 98.2°F | Ht 70.0 in | Wt 296.2 lb

## 2018-05-23 DIAGNOSIS — G2581 Restless legs syndrome: Secondary | ICD-10-CM | POA: Diagnosis not present

## 2018-05-23 DIAGNOSIS — E785 Hyperlipidemia, unspecified: Secondary | ICD-10-CM | POA: Diagnosis not present

## 2018-05-23 DIAGNOSIS — I10 Essential (primary) hypertension: Secondary | ICD-10-CM

## 2018-05-23 DIAGNOSIS — R7303 Prediabetes: Secondary | ICD-10-CM | POA: Insufficient documentation

## 2018-05-23 DIAGNOSIS — Z125 Encounter for screening for malignant neoplasm of prostate: Secondary | ICD-10-CM

## 2018-05-23 DIAGNOSIS — Z23 Encounter for immunization: Secondary | ICD-10-CM | POA: Diagnosis not present

## 2018-05-23 DIAGNOSIS — E119 Type 2 diabetes mellitus without complications: Secondary | ICD-10-CM

## 2018-05-23 LAB — HEMOGLOBIN A1C: Hgb A1c MFr Bld: 6.6 % — ABNORMAL HIGH (ref 4.6–6.5)

## 2018-05-23 LAB — COMPREHENSIVE METABOLIC PANEL
ALT: 33 U/L (ref 0–53)
AST: 27 U/L (ref 0–37)
Albumin: 4.2 g/dL (ref 3.5–5.2)
Alkaline Phosphatase: 67 U/L (ref 39–117)
BUN: 11 mg/dL (ref 6–23)
CO2: 32 mEq/L (ref 19–32)
Calcium: 9.3 mg/dL (ref 8.4–10.5)
Chloride: 96 mEq/L (ref 96–112)
Creatinine, Ser: 0.85 mg/dL (ref 0.40–1.50)
GFR: 98.36 mL/min (ref >60.00)
Glucose, Bld: 92 mg/dL (ref 70–99)
Potassium: 4.5 mEq/L (ref 3.5–5.1)
Sodium: 136 mEq/L (ref 135–145)
Total Bilirubin: 0.5 mg/dL (ref 0.2–1.2)
Total Protein: 7.1 g/dL (ref 6.0–8.3)

## 2018-05-23 LAB — LIPID PANEL
Cholesterol: 202 mg/dL — ABNORMAL HIGH (ref 0–200)
HDL: 43.8 mg/dL (ref >39.00)
NonHDL: 158.46
Total CHOL/HDL Ratio: 5
Triglycerides: 279 mg/dL — ABNORMAL HIGH (ref 0.0–149.0)
VLDL: 55.8 mg/dL — ABNORMAL HIGH (ref 0.0–40.0)

## 2018-05-23 LAB — LDL CHOLESTEROL, DIRECT: Direct LDL: 129 mg/dL

## 2018-05-23 LAB — PSA: PSA: 0.37 ng/mL (ref 0.10–4.00)

## 2018-05-23 MED ORDER — LOSARTAN POTASSIUM-HCTZ 50-12.5 MG PO TABS: ORAL_TABLET | ORAL | 0 refills | Status: DC

## 2018-05-23 MED ORDER — GABAPENTIN 100 MG PO CAPS: ORAL_CAPSULE | ORAL | 0 refills | Status: DC

## 2018-05-23 NOTE — Assessment & Plan Note (Addendum)
Above goal on lisinopril-HCTZ 20-12.5 mg. Suspect lisinopril to be causing cough. Change to losartan-HCTZ 50-12.5 mg. We will see him back in three weeks for BP check. BMP unremarkable.
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
## 2018-05-23 NOTE — Assessment & Plan Note (Signed)
Compliant to pravastatin.  Lipid panel returned after patient visit and shows uncontrolled hyperlipidemia on pravastatin. Will try to switch to Crestor as he could not tolerate Lipitor. LDL goal is <100.

## 2018-05-23 NOTE — Assessment & Plan Note (Signed)
A1C collected during patient's visit. Labs after patient's visit showing A1C of 6.6. Will notify patient of new diagnosis and start metformin.   Already managed on ARB and stating.

## 2018-05-23 NOTE — Patient Instructions (Addendum)
Start gabapentin 100 mg capsules. Take 1-3 capsules one hour before bedtime for restless legs. Please update me in 2-3 weeks.   Stop by the lab prior to leaving today. I will notify you of your results once received.   I'll send refills of your pravastatin once I receive your cholesterol levels.  Stop lisinopril-hydrochlorothiazide for blood pressure as this is likely causing your cough.   Start losartan-hydrochlorothiazide 50-12.5 mg tablets for blood pressure. Take 1 tablet daily.  Schedule a follow up visit in 3 weeks for blood pressure check.  It was a pleasure to see you today!  Restless Legs Syndrome Restless legs syndrome is a condition that causes uncomfortable feelings or sensations in the legs, especially while sitting or lying down. The sensations usually cause an overwhelming urge to move the legs. The arms can also sometimes be affected. The condition can range from mild to severe. The symptoms often interfere with a person's ability to sleep. What are the causes? The cause of this condition is not known. What increases the risk? This condition is more likely to develop in:  People who are older than age 58.  Pregnant women. In general, restless legs syndrome is more common in women than in men.  People who have a family history of the condition.  People who have certain medical conditions, such as iron deficiency, kidney disease, Parkinson disease, or nerve damage.  People who take certain medicines, such as medicines for high blood pressure, nausea, colds, allergies, depression, and some heart conditions.  What are the signs or symptoms? The main symptom of this condition is uncomfortable sensations in the legs. These sensations may be:  Described as pulling, tingling, prickling, throbbing, crawling, or burning.  Worse while you are sitting or lying down.  Worse during periods of rest or inactivity.  Worse at night, often interfering with your  sleep.  Accompanied by a very strong urge to move your legs.  Temporarily relieved by movement of your legs.  The sensations usually affect both sides of the body. The arms can also be affected, but this is rare. People who have this condition often have tiredness during the day because of their lack of sleep at night. How is this diagnosed? This condition may be diagnosed based on your description of the symptoms. You may also have tests, including blood tests, to check for other conditions that may lead to your symptoms. In some cases, you may be asked to spend some time in a sleep lab so your sleeping can be monitored. How is this treated? Treatment for this condition is focused on managing the symptoms. Treatment may include:  Self-help and lifestyle changes.  Medicines.  Follow these instructions at home:  Take medicines only as directed by your health care provider.  Try these methods to get temporary relief from the uncomfortable sensations: ? Massage your legs. ? Walk or stretch. ? Take a cold or hot bath.  Practice good sleep habits. For example, go to bed and get up at the same time every day.  Exercise regularly.  Practice ways of relaxing, such as yoga or meditation.  Avoid caffeine and alcohol.  Do not use any tobacco products, including cigarettes, chewing tobacco, or electronic cigarettes. If you need help quitting, ask your health care provider.  Keep all follow-up visits as directed by your health care provider. This is important. Contact a health care provider if: Your symptoms do not improve with treatment, or they get worse. This information is not  intended to replace advice given to you by your health care provider. Make sure you discuss any questions you have with your health care provider. Document Released: 08/04/2002 Document Revised: 01/20/2016 Document Reviewed: 08/10/2014 Elsevier Interactive Patient Education  [employer].

## 2018-05-23 NOTE — Assessment & Plan Note (Signed)
Diagnosed in 2017, did not stick with treatment. Will re-initiate gabapentin at 100-300 mg HS. He will update during his next visit.

## 2018-05-24 ENCOUNTER — Telehealth: Payer: Self-pay | Admitting: Primary Care

## 2018-05-24 MED ORDER — LOSARTAN POTASSIUM 50 MG PO TABS: 50.0000 mg | ORAL_TABLET | Freq: Every day | ORAL | 0 refills | Status: DC

## 2018-05-24 MED ORDER — HYDROCHLOROTHIAZIDE 12.5 MG PO TABS: 12.5000 mg | ORAL_TABLET | Freq: Every day | ORAL | 0 refills | Status: DC

## 2018-05-24 NOTE — Telephone Encounter (Signed)
Received fax from [facility] stated that Hyzaar is on back order and ehy asking to change to 2 seperate Rx.  Got the okay from Allie Bossier and will change as requested.

## 2018-05-31 ENCOUNTER — Encounter: Payer: Self-pay | Admitting: Primary Care

## 2018-06-03 ENCOUNTER — Other Ambulatory Visit: Payer: Self-pay | Admitting: Primary Care

## 2018-06-03 DIAGNOSIS — E119 Type 2 diabetes mellitus without complications: Secondary | ICD-10-CM

## 2018-06-03 DIAGNOSIS — E785 Hyperlipidemia, unspecified: Secondary | ICD-10-CM

## 2018-06-03 MED ORDER — METFORMIN HCL 500 MG PO TABS: 500.0000 mg | ORAL_TABLET | Freq: Two times a day (BID) | ORAL | 1 refills | Status: DC

## 2018-06-03 MED ORDER — ROSUVASTATIN CALCIUM 10 MG PO TABS: ORAL_TABLET | ORAL | 3 refills | Status: DC

## 2018-06-11 ENCOUNTER — Encounter: Payer: Self-pay | Admitting: Primary Care

## 2018-06-11 ENCOUNTER — Ambulatory Visit: Payer: BLUE CROSS/BLUE SHIELD | Admitting: Primary Care

## 2018-06-11 VITALS — BP 146/92 | HR 89 | Temp 98.2°F | Ht 70.0 in | Wt 294.0 lb

## 2018-06-11 DIAGNOSIS — I10 Essential (primary) hypertension: Secondary | ICD-10-CM | POA: Diagnosis not present

## 2018-06-11 DIAGNOSIS — N62 Hypertrophy of breast: Secondary | ICD-10-CM | POA: Diagnosis not present

## 2018-06-11 DIAGNOSIS — E119 Type 2 diabetes mellitus without complications: Secondary | ICD-10-CM

## 2018-06-11 DIAGNOSIS — G2581 Restless legs syndrome: Secondary | ICD-10-CM

## 2018-06-11 MED ORDER — LOSARTAN POTASSIUM 100 MG PO TABS: ORAL_TABLET | ORAL | 0 refills | Status: DC

## 2018-06-11 NOTE — Assessment & Plan Note (Signed)
Compliant to metformin without side effects. Long discussion regarding diet and exercise.

## 2018-06-11 NOTE — Assessment & Plan Note (Signed)
Cough has nearly resolved since switching to losartan, however, BP uncontrolled. Continue HCTZ 12.5 mg, increase losartan to 100 mg.   He will monitor BP as discussed, follow up in 3 months.

## 2018-06-11 NOTE — Assessment & Plan Note (Signed)
Much improved with gabapentin 200 mg, continue to monitor.

## 2018-06-11 NOTE — Progress Notes (Signed)
Subjective:    Patient ID: Samuel Heath, male    DOB: 03-04-1961, 56 y.o.   MRN: [id number]  HPI  Samuel Heath is a 57 year old male who presents today for follow up.   1) Essential Hypertension: He was last evaluated in late September with elevated blood pressure readings despite being managed on lisinopril-hydrochlorothiazide 20-12.5 mg. He also endorsed a tickle to his throat which caused a dry cough intermittently. His medication was changed to losartan-HCTZ 50-12.5 mg and he was asked to follow back up for re-evaluation.  Since his last visit his cough has improved. He is not checking his BP at home. He denies chest pain, dizziness.   BP Readings from Last 3 Encounters:  06/11/18 (!) 146/92  05/23/18 (!) 146/82  06/20/17 135/77   2) Restless Leg Syndrome: Diagnosed in 2017. Last visit he endorsed continued symptoms which prevented his wife from sleeping in the same bed. He was re-initiated on gabapentin 100-300 mg HS.  Since his last visit he's noticed improvement. He's taking 200 mg nightly and is doing much better.   3) Breast Pain: Located to bilateral breasts that occurs on palpation. This has been noticeable for the last several years. He doesn't experience pain without palpation, only with palpation. He denies a family history of breast cancer, he has not noticed any lumps or skin texture changes. Symptoms are no worse, just chronic.   Review of Systems  Eyes: Negative for visual disturbance.  Respiratory: Negative for shortness of breath.   Cardiovascular: Negative for chest pain.  Musculoskeletal:       Bilateral breast pain  Neurological: Negative for dizziness and headaches.       Past Medical History:  Diagnosis Date  . Alcohol abuse   . Coronary artery disease    Coronary CTA (7/10) showed calcium score 424 and was indeterminant for coronary disease; LHC was done (7/10) showing EF 55%,30% proximal and mid LAD stenoses, 90% stenosis ostially in a moderate septal  perforator, otherwise luminal irregularities; pt managed medically  . Erectile dysfunction   . Gastroesophageal reflux disease with hiatal hernia   . Hyperlipidemia   . Hypertension   . Hypothyroidism   . Statin intolerance    With atorvastatin     Social History   Socioeconomic History  . Marital status: Married    Spouse name: Not on file  . Number of children: Not on file  . Years of education: Not on file  . Highest education level: Not on file  Occupational History  . Occupation: Truck driver    Comment: [place]  . Financial resource strain: Not on file  . Food insecurity:    Worry: Not on file    Inability: Not on file  . Transportation needs:    Medical: Not on file    Non-medical: Not on file  Tobacco Use  . Smoking status: Never Smoker  . Smokeless tobacco: Former Systems developer  . Tobacco comment: Denies tobacco use  Substance and Sexual Activity  . Alcohol use: Yes    Alcohol/week: 60.0 standard drinks    Types: 60 Cans of beer per week    Comment: More than a 12-pack of beer a day.10-20-15 now down to 6 beers /day  . Drug use: No  . Sexual activity: Not on file  Lifestyle  . Physical activity:    Days per week: Not on file    Minutes per session: Not on file  . Stress:  Not on file  Relationships  . Social connections:    Talks on phone: Not on file    Gets together: Not on file    Attends religious service: Not on file    Active member of club or organization: Not on file    Attends meetings of clubs or organizations: Not on file    Relationship status: Not on file  . Intimate partner violence:    Fear of current or ex partner: Not on file    Emotionally abused: Not on file    Physically abused: Not on file    Forced sexual activity: Not on file  Other Topics Concern  . Not on file  Social History Narrative   Married.   2 children.   Works as a Administrator.   Enjoys going to [employer], antiquing.     Past Surgical  History:  Procedure Laterality Date  . CARDIAC CATHETERIZATION  2010  . [facility] surgery     had "tubes' put in right side (?30 years ago)"through and through" wound  . LUMBAR LAMINECTOMY/DECOMPRESSION MICRODISCECTOMY Left 12/12/2013   Procedure: LUMBAR LAMINECTOMY/DECOMPRESSION MICRODISCECTOMY 1 LEVEL;  Surgeon: Consuella Lose, MD;  Location: [facility] [facility];  Service: Neurosurgery;  Laterality: Left;  Left L45 microdiskectomy  . TONSILLECTOMY    . TOTAL HIP ARTHROPLASTY Left 10/26/2015   Procedure: LEFT TOTAL HIP ARTHROPLASTY ANTERIOR APPROACH;  Surgeon: Paralee Cancel, MD;  Location: [facility];  Service: Orthopedics;  Laterality: Left;  Marland Kitchen VASECTOMY    . WISDOM TOOTH EXTRACTION      Family History  Problem Relation Age of Onset  . Cancer Mother   . Peripheral vascular disease Father   . Coronary artery disease Father   . Alcohol abuse Sister     No Known Allergies  Current Outpatient Medications on File Prior to Visit  Medication Sig Dispense Refill  . aspirin 81 MG chewable tablet Chew by mouth.    . Cyanocobalamin (VITAMIN B 12 PO) Take 1 tablet by mouth daily. Reported on 12/09/2015    . gabapentin (NEURONTIN) 100 MG capsule Take 1-3 capsules one hour before bedtime for restless legs. 90 capsule 0  . hydrochlorothiazide (HYDRODIURIL) 12.5 MG tablet Take 1 tablet (12.5 mg total) by mouth daily. 30 tablet 0  . metFORMIN (GLUCOPHAGE) 500 MG tablet Take 1 tablet (500 mg total) by mouth 2 (two) times daily with a meal. For diabetes. 180 tablet 1  . Multiple Vitamins-Minerals (PX COMPLETE SENIOR MULTIVITS) TABS Take by mouth.    Marland Kitchen omeprazole (PRILOSEC) 20 MG capsule Take 20 mg by mouth daily.    . rosuvastatin (CRESTOR) 10 MG tablet Take 1 tablet by mouth every evening for cholesterol. 90 tablet 3  . losartan-hydrochlorothiazide (HYZAAR) 50-12.5 MG tablet Take 1 tablet by mouth once daily for blood pressure. (Patient not taking: Reported on 06/11/2018) 30 tablet 0   Current  Facility-Administered Medications on File Prior to Visit  Medication Dose Route Frequency Provider Last Rate Last Dose  . betamethasone acetate-betamethasone sodium phosphate (CELESTONE) injection 12 mg  12 mg Intramuscular Once Evans, Brent M, DPM        BP (!) 146/92   Pulse 89   Temp 98.2 F (36.8 C) (Oral)   Ht 5\' 10"  (1.778 m)   Wt 294 lb (133.4 kg)   SpO2 92%   BMI 42.18 kg/m    Objective:   Physical Exam  Constitutional: He appears well-nourished.  Cardiovascular: Normal rate and regular rhythm.  Respiratory: Effort normal  and breath sounds normal. He exhibits no mass. Right breast exhibits tenderness. Right breast exhibits no mass and no skin change. Left breast exhibits tenderness. Left breast exhibits no mass and no skin change.    Skin: Skin is warm and dry. No erythema.           Assessment & Plan:

## 2018-06-11 NOTE — Assessment & Plan Note (Signed)
Chronic, doubt medication cause given chronicity. Check labs for hormonal cause. He will return between 8 am and 10 am for testing. Exam today without suspicion for malignancy, doubt this to be the cause given bilateral symptoms. Keep on differential. Await results.

## 2018-06-11 NOTE — Patient Instructions (Signed)
We've increased the dose of your losartan to 100 mg. You may take two of the 50 mg tablets to equal 100 mg until your current bottle is empty.  Continue taking hydrochlorothiazide 12.5 mg for blood pressure.   Start exercising. You should be getting 150 minutes of moderate intensity exercise weekly.  It is important that you improve your diet. Please limit carbohydrates in the form of white bread, rice, pasta, sweets, fast food, fried food, sugary drinks, etc. Increase your consumption of fresh fruits and vegetables, whole grains, lean protein.  Ensure you are consuming 64 ounces of water daily.  Schedule a follow up visit in 3 months for a diabetes check.  It was a pleasure to see you today!   Diabetes Mellitus and Nutrition When you have diabetes (diabetes mellitus), it is very important to have healthy eating habits because your blood sugar (glucose) levels are greatly affected by what you eat and drink. Eating healthy foods in the appropriate amounts, at about the same times every day, can help you:  Control your blood glucose.  Lower your risk of heart disease.  Improve your blood pressure.  Reach or maintain a healthy weight.  Every person with diabetes is different, and each person has different needs for a meal plan. Your health care provider may recommend that you work with a diet and nutrition specialist (dietitian) to make a meal plan that is best for you. Your meal plan may vary depending on factors such as:  The calories you need.  The medicines you take.  Your weight.  Your blood glucose, blood pressure, and cholesterol levels.  Your activity level.  Other health conditions you have, such as heart or kidney disease.  How do carbohydrates affect me? Carbohydrates affect your blood glucose level more than any other type of food. Eating carbohydrates naturally increases the amount of glucose in your blood. Carbohydrate counting is a method for keeping track of how  many carbohydrates you eat. Counting carbohydrates is important to keep your blood glucose at a healthy level, especially if you use insulin or take certain oral diabetes medicines. It is important to know how many carbohydrates you can safely have in each meal. This is different for every person. Your dietitian can help you calculate how many carbohydrates you should have at each meal and for snack. Foods that contain carbohydrates include:  Bread, cereal, rice, pasta, and crackers.  Potatoes and corn.  Peas, beans, and lentils.  Milk and yogurt.  Fruit and juice.  Desserts, such as cakes, cookies, ice cream, and candy.  How does alcohol affect me? Alcohol can cause a sudden decrease in blood glucose (hypoglycemia), especially if you use insulin or take certain oral diabetes medicines. Hypoglycemia can be a life-threatening condition. Symptoms of hypoglycemia (sleepiness, dizziness, and confusion) are similar to symptoms of having too much alcohol. If your health care provider says that alcohol is safe for you, follow these guidelines:  Limit alcohol intake to no more than 1 drink per day for nonpregnant women and 2 drinks per day for men. One drink equals 12 oz of beer, 5 oz of wine, or 1 oz of hard liquor.  Do not drink on an empty stomach.  Keep yourself hydrated with water, diet soda, or unsweetened iced tea.  Keep in mind that regular soda, juice, and other mixers may contain a lot of sugar and must be counted as carbohydrates.  What are tips for following this plan? Reading food labels  Start by  checking the serving size on the label. The amount of calories, carbohydrates, fats, and other nutrients listed on the label are based on one serving of the food. Many foods contain more than one serving per package.  Check the total grams (g) of carbohydrates in one serving. You can calculate the number of servings of carbohydrates in one serving by dividing the total carbohydrates  by 15. For example, if a food has 30 g of total carbohydrates, it would be equal to 2 servings of carbohydrates.  Check the number of grams (g) of saturated and trans fats in one serving. Choose foods that have low or no amount of these fats.  Check the number of milligrams (mg) of sodium in one serving. Most people should limit total sodium intake to less than 2,300 mg per day.  Always check the nutrition information of foods labeled as "low-fat" or "nonfat". These foods may be higher in added sugar or refined carbohydrates and should be avoided.  Talk to your dietitian to identify your daily goals for nutrients listed on the label. Shopping  Avoid buying canned, premade, or processed foods. These foods tend to be high in fat, sodium, and added sugar.  Shop around the outside edge of the grocery store. This includes fresh fruits and vegetables, bulk grains, fresh meats, and fresh dairy. Cooking  Use low-heat cooking methods, such as baking, instead of high-heat cooking methods like deep frying.  Cook using healthy oils, such as olive, canola, or sunflower oil.  Avoid cooking with butter, cream, or high-fat meats. Meal planning  Eat meals and snacks regularly, preferably at the same times every day. Avoid going long periods of time without eating.  Eat foods high in fiber, such as fresh fruits, vegetables, beans, and whole grains. Talk to your dietitian about how many servings of carbohydrates you can eat at each meal.  Eat 4-6 ounces of lean protein each day, such as lean meat, chicken, fish, eggs, or tofu. 1 ounce is equal to 1 ounce of meat, chicken, or fish, 1 egg, or 1/4 cup of tofu.  Eat some foods each day that contain healthy fats, such as avocado, nuts, seeds, and fish. Lifestyle   Check your blood glucose regularly.  Exercise at least 30 minutes 5 or more days each week, or as told by your health care provider.  Take medicines as told by your health care provider.  Do  not use any products that contain nicotine or tobacco, such as cigarettes and e-cigarettes. If you need help quitting, ask your health care provider.  Work with a Social worker or diabetes educator to identify strategies to manage stress and any emotional and social challenges. What are some questions to ask my health care provider?  Do I need to meet with a diabetes educator?  Do I need to meet with a dietitian?  What number can I call if I have questions?  When are the best times to check my blood glucose? Where to find more information:  American Diabetes Association: diabetes.org/food-and-fitness/food  Academy of Nutrition and Dietetics: PokerClues.dk  [employer] of Diabetes and Digestive and Kidney Diseases (NIH): ContactWire.be Summary  A healthy meal plan will help you control your blood glucose and maintain a healthy lifestyle.  Working with a diet and nutrition specialist (dietitian) can help you make a meal plan that is best for you.  Keep in mind that carbohydrates and alcohol have immediate effects on your blood glucose levels. It is important to count carbohydrates and  to use alcohol carefully. This information is not intended to replace advice given to you by your health care provider. Make sure you discuss any questions you have with your health care provider. Document Released: 05/11/2005 Document Revised: 09/18/2016 Document Reviewed: 09/18/2016 Elsevier Interactive Patient Education  [employer].

## 2018-06-13 ENCOUNTER — Ambulatory Visit: Payer: BLUE CROSS/BLUE SHIELD | Admitting: Primary Care

## 2018-06-24 ENCOUNTER — Other Ambulatory Visit: Payer: Self-pay | Admitting: Primary Care

## 2018-07-11 ENCOUNTER — Other Ambulatory Visit: Payer: Self-pay | Admitting: Primary Care

## 2018-07-11 DIAGNOSIS — G2581 Restless legs syndrome: Secondary | ICD-10-CM

## 2018-07-12 NOTE — Telephone Encounter (Signed)
Noted, refill sent to pharmacy. 

## 2018-07-12 NOTE — Telephone Encounter (Signed)
Last prescribed on 05/23/2018  Last office visit on 06/11/2018

## 2018-08-19 ENCOUNTER — Encounter: Payer: Self-pay | Admitting: Podiatry

## 2018-08-19 ENCOUNTER — Encounter

## 2018-08-19 ENCOUNTER — Ambulatory Visit: Payer: BLUE CROSS/BLUE SHIELD | Admitting: Podiatry

## 2018-08-19 ENCOUNTER — Other Ambulatory Visit: Payer: Self-pay | Admitting: Podiatry

## 2018-08-19 ENCOUNTER — Ambulatory Visit (INDEPENDENT_AMBULATORY_CARE_PROVIDER_SITE_OTHER): Payer: BLUE CROSS/BLUE SHIELD

## 2018-08-19 VITALS — BP 87/62 | HR 95 | Temp 98.2°F | Resp 16

## 2018-08-19 DIAGNOSIS — M779 Enthesopathy, unspecified: Secondary | ICD-10-CM

## 2018-08-19 DIAGNOSIS — M778 Other enthesopathies, not elsewhere classified: Secondary | ICD-10-CM

## 2018-08-19 DIAGNOSIS — M7752 Other enthesopathy of left foot: Secondary | ICD-10-CM | POA: Diagnosis not present

## 2018-08-19 MED ORDER — METHYLPREDNISOLONE 4 MG PO TABS: 4.0000 mg | ORAL_TABLET | Freq: Every day | ORAL | 0 refills | Status: DC

## 2018-08-19 MED ORDER — DOXYCYCLINE HYCLATE 100 MG PO TABS: 100.0000 mg | ORAL_TABLET | Freq: Two times a day (BID) | ORAL | 0 refills | Status: DC

## 2018-08-19 NOTE — Progress Notes (Signed)
He presents today states that he has severe pain in his left foot.  Pain is 10 out of 10 is a sharp stabbing pain he tells me to walk on it.  He had surgery little over a year ago for fifth metatarsal osteotomies a bit noncompliant at that time but states that he was doing good for a long time.  He states this is been going on for about 2 months. Objective: Vital signs are stable he is alert and oriented x3.  Pulses are palpable.  Neurologic sensorium is intact degenerative flexors are intact muscle joints normal symmetrical.  Fifth metatarsal phalangeal joint area is red and indurated.  It is seems to have fluctuance underneath the tissue.  There is a large pre-ulcerative lesion brewing with no hyperkeratotic skin but bruising beneath the skin.  Is to the lateral aspect of the foot is not on the plantar surface so most likely this is related to shoe gear.  Assessment: Bursitis pre-ulcerative skin breakdown left fifth metatarsal.  Plan: Started him on doxycycline gave him a short-term 4 mg Medrol Dosepak and an injection of 10 mg of Kenalog and local anesthetic surrounding the joint.  I will follow-up with him in 2 weeks

## 2018-09-04 ENCOUNTER — Encounter (INDEPENDENT_AMBULATORY_CARE_PROVIDER_SITE_OTHER): Payer: BLUE CROSS/BLUE SHIELD | Admitting: Podiatry

## 2018-09-04 NOTE — Progress Notes (Signed)
This encounter was created in error - please disregard.

## 2018-09-12 ENCOUNTER — Ambulatory Visit: Payer: BLUE CROSS/BLUE SHIELD | Admitting: Primary Care

## 2018-09-19 ENCOUNTER — Other Ambulatory Visit: Payer: Self-pay | Admitting: Primary Care

## 2018-09-19 DIAGNOSIS — I10 Essential (primary) hypertension: Secondary | ICD-10-CM

## 2018-12-27 ENCOUNTER — Other Ambulatory Visit: Payer: Self-pay | Admitting: Primary Care

## 2019-03-27 ENCOUNTER — Other Ambulatory Visit: Payer: Self-pay | Admitting: Primary Care

## 2019-03-27 DIAGNOSIS — E119 Type 2 diabetes mellitus without complications: Secondary | ICD-10-CM

## 2019-03-27 DIAGNOSIS — I10 Essential (primary) hypertension: Secondary | ICD-10-CM

## 2019-03-28 NOTE — Telephone Encounter (Signed)
Needs in office follow up visit. Appointment made for follow up DM, HTN with patient's wife. Ok per [employer] on file.

## 2019-04-02 ENCOUNTER — Ambulatory Visit: Payer: BLUE CROSS/BLUE SHIELD | Admitting: Primary Care

## 2019-06-25 ENCOUNTER — Other Ambulatory Visit: Payer: Self-pay | Admitting: Primary Care

## 2019-07-31 ENCOUNTER — Other Ambulatory Visit: Payer: Self-pay | Admitting: Podiatry

## 2019-07-31 ENCOUNTER — Telehealth: Payer: Self-pay | Admitting: *Deleted

## 2019-07-31 ENCOUNTER — Ambulatory Visit (INDEPENDENT_AMBULATORY_CARE_PROVIDER_SITE_OTHER): Payer: BC Managed Care – PPO

## 2019-07-31 ENCOUNTER — Other Ambulatory Visit: Payer: Self-pay

## 2019-07-31 ENCOUNTER — Ambulatory Visit (INDEPENDENT_AMBULATORY_CARE_PROVIDER_SITE_OTHER): Payer: BC Managed Care – PPO | Admitting: Podiatry

## 2019-07-31 DIAGNOSIS — L02619 Cutaneous abscess of unspecified foot: Secondary | ICD-10-CM

## 2019-07-31 DIAGNOSIS — I739 Peripheral vascular disease, unspecified: Secondary | ICD-10-CM

## 2019-07-31 DIAGNOSIS — L03116 Cellulitis of left lower limb: Secondary | ICD-10-CM | POA: Diagnosis not present

## 2019-07-31 DIAGNOSIS — L03119 Cellulitis of unspecified part of limb: Secondary | ICD-10-CM | POA: Diagnosis not present

## 2019-07-31 DIAGNOSIS — M79672 Pain in left foot: Secondary | ICD-10-CM

## 2019-07-31 MED ORDER — AMOXICILLIN-POT CLAVULANATE 875-125 MG PO TABS: 1.0000 | ORAL_TABLET | Freq: Two times a day (BID) | ORAL | 1 refills | Status: DC

## 2019-07-31 NOTE — Telephone Encounter (Signed)
Samuel Heath states she received the STAT referral without the notes, and in her experience a cold limb with a wound would be sent to the ED. I told [name] I was instructed by Dr. Milinda Pointer to refer to Samuel, and asked if they had available appts for pt or did I need to inform Dr. Milinda Pointer and call pt to go to the ED. Samuel Heath states she will need to talk with [name] about a urgent appt for pt, today is at the end of the day and they have an appt tomorrow 2:00pm for ABI then 2:40pm for appt and asked if pt was still in the office and I informed them the pt had been in the office this morning and the referral had been place and sent this morning. I told [name] I would fax today's clinicals once received.

## 2019-07-31 NOTE — Telephone Encounter (Signed)
Dr. Milinda Pointer ordered STAT referral to [facility] for left lower extremity coldness and wound to foot. STAT referral made to [facility] on required form with pt's urgent contact number [phone], and note stating today's clinicals will be faxed as soon as available and demographics and faxed to [facility].

## 2019-08-01 ENCOUNTER — Ambulatory Visit (INDEPENDENT_AMBULATORY_CARE_PROVIDER_SITE_OTHER): Payer: Self-pay | Admitting: Vascular Surgery

## 2019-08-01 ENCOUNTER — Encounter: Payer: Self-pay | Admitting: Vascular Surgery

## 2019-08-01 ENCOUNTER — Ambulatory Visit (HOSPITAL_COMMUNITY)
Admission: RE | Admit: 2019-08-01 | Discharge: 2019-08-01 | Disposition: A | Discharge status: Home / Self Care | Payer: BC Managed Care – PPO | Source: Ambulatory Visit | Attending: Family | Admitting: Family

## 2019-08-01 VITALS — BP 149/92 | HR 90 | Temp 97.9°F | Resp 20 | Ht 70.0 in | Wt 247.9 lb

## 2019-08-01 DIAGNOSIS — I739 Peripheral vascular disease, unspecified: Secondary | ICD-10-CM | POA: Insufficient documentation

## 2019-08-01 NOTE — Progress Notes (Signed)
Patient ID: Samuel Heath, male   DOB: 05-Sep-1960, 58 y.o.   MRN: [id number]  Reason for Consult: New Patient (Initial Visit)   Referred by Pleas Koch, NP  Subjective:     HPI:  Samuel Heath is a 58 y.o. male history of bilateral foot surgeries.  Has had some issues with healing the left side on the lateral aspect of his foot.  Is never had vascular surgery.  Does have cold feet particularly the left foot this has been stable throughout his life.  Has a statin intolerance.  Does take aspirin daily.  He has never been a smoker.  Does drink light beer daily.  He takes Glucophage for diabetes.  Continues to work daily.  Does not have any claudication-like symptoms.  Past Medical History:  Diagnosis Date   Alcohol abuse    Coronary artery disease    Coronary CTA (7/10) showed calcium score 424 and was indeterminant for coronary disease; LHC was done (7/10) showing EF 55%,30% proximal and mid LAD stenoses, 90% stenosis ostially in a moderate septal perforator, otherwise luminal irregularities; pt managed medically   Erectile dysfunction    Gastroesophageal reflux disease with hiatal hernia    Hyperlipidemia    Hypertension    Hypothyroidism    Statin intolerance    With atorvastatin   Family History  Problem Relation Age of Onset   Cancer Mother    Peripheral vascular disease Father    Coronary artery disease Father    Alcohol abuse Sister    Past Surgical History:  Procedure Laterality Date   CARDIAC CATHETERIZATION  2010   [place] surgery     had "tubes' put in right side (?30 years ago)"through and through" wound   LUMBAR LAMINECTOMY/DECOMPRESSION MICRODISCECTOMY Left 12/12/2013   Procedure: LUMBAR LAMINECTOMY/DECOMPRESSION MICRODISCECTOMY 1 LEVEL;  Surgeon: Consuella Lose, MD;  Location: [place] [facility];  Service: Neurosurgery;  Laterality: Left;  Left L45 microdiskectomy   TONSILLECTOMY     TOTAL HIP ARTHROPLASTY Left 10/26/2015   Procedure: LEFT TOTAL  HIP ARTHROPLASTY ANTERIOR APPROACH;  Surgeon: Paralee Cancel, MD;  Location: [facility];  Service: Orthopedics;  Laterality: Left;   VASECTOMY     WISDOM TOOTH EXTRACTION      Short Social History:  Social History   Tobacco Use   Smoking status: Never Smoker   Smokeless tobacco: Former Systems developer   Tobacco comment: Denies tobacco use  Substance Use Topics   Alcohol use: Yes    Alcohol/week: 60.0 standard drinks    Types: 60 Cans of beer per week    Comment: More than a 12-pack of beer a day.10-20-15 now down to 6 beers /day    No Known Allergies  Current Outpatient Medications  Medication Sig Dispense Refill   amoxicillin-clavulanate (AUGMENTIN) 875-125 MG tablet Take 1 tablet by mouth 2 (two) times daily. 20 tablet 1   aspirin 81 MG chewable tablet Chew by mouth.     Cyanocobalamin (VITAMIN B 12 PO) Take 1 tablet by mouth daily. Reported on 12/09/2015     gabapentin (NEURONTIN) 100 MG capsule Take 2 capsules by mouth at bedtime for restless legs. 180 capsule 1   hydrochlorothiazide (HYDRODIURIL) 12.5 MG tablet Take 1 tablet (12.5 mg total) by mouth daily. NEED APPOINTMENT FOR ANY MORE REFILLS 30 tablet 0   losartan (COZAAR) 100 MG tablet Take 1 tablet by mouth once daily for blood pressure 90 tablet 0   metFORMIN (GLUCOPHAGE) 500 MG tablet TAKE 1 TABLET BY MOUTH TWICE  DAILY AS NEEDED WITH A MEAL FOR DIABETES 180 tablet 0   Multiple Vitamins-Minerals (PX COMPLETE SENIOR MULTIVITS) TABS Take by mouth.     omeprazole (PRILOSEC) 20 MG capsule Take 20 mg by mouth daily.     rosuvastatin (CRESTOR) 10 MG tablet Take 1 tablet by mouth every evening for cholesterol. 90 tablet 3   Current Facility-Administered Medications  Medication Dose Route Frequency Provider Last Rate Last Dose   betamethasone acetate-betamethasone sodium phosphate (CELESTONE) injection 12 mg  12 mg Intramuscular Once Edrick Kins, DPM        Review of Systems  Constitutional:  Constitutional negative. HENT:  HENT negative.  Eyes: Eyes negative.  Respiratory: Respiratory negative.  Cardiovascular: Cardiovascular negative.  Musculoskeletal: Musculoskeletal negative.  Skin: Positive for wound.  Neurological: Neurological negative. Hematologic: Hematologic/lymphatic negative.  Psychiatric: Psychiatric negative.        Objective:  Objective   Vitals:   08/01/19 1419  BP: (!) 149/92  Pulse: 90  Resp: 20  Temp: 97.9 F (36.6 C)  SpO2: 95%  Weight: 247 lb 14.4 oz (112.4 kg)  Height: 5\' 10"  (1.778 m)   Body mass index is 35.57 kg/m.  Physical Exam HENT:     Head: Normocephalic.     Nose: Nose normal.     Mouth/Throat:     Mouth: Mucous membranes are moist.  Eyes:     Pupils: Pupils are equal, round, and reactive to light.  Neck:     Musculoskeletal: Normal range of motion.  Cardiovascular:     Rate and Rhythm: Normal rate.     Pulses:          Popliteal pulses are 2+ on the right side and 2+ on the left side.     Comments: He has a 2+ palpable anterior tibial pulse Abdominal:     General: Abdomen is flat.     Palpations: Abdomen is soft.  Musculoskeletal: Normal range of motion.  Skin:    Capillary Refill: Capillary refill takes less than 2 seconds.  Neurological:     General: No focal deficit present.     Mental Status: He is alert.  Psychiatric:        Mood and Affect: Mood normal.        Behavior: Behavior normal.        Thought Content: Thought content normal.        Judgment: Judgment normal.     Data: 1.16 ABIs bilaterally with toe pressure 101 on the right 95 on the left     Assessment/Plan:     58 year old male sent for evaluation bilateral lower extremity cold feet with nonhealing wound in the left lateral aspect of his foot.  He does have very cold feet but ABIs are normal.  Does not need any vascular invention at this time.  Can follow-up on an as-needed basis.     Waynetta Sandy MD [facility]

## 2019-08-02 NOTE — Progress Notes (Signed)
He presents today chief complaint of severe pain to the fifth metatarsophalangeal joint of the left foot.  States is been going on now for over a month states that the pain is a 10 out of 10 its constant pain he has redness and swelling and has a bruised appearance.  Denies any injury denies drainage or skin breakdown.  He is currently using his surgical shoe taking Tylenol and trying wider shoes for work.  He states that he has noticed his left leg started become much cooler than his right leg.  And the pain is 10 out of 10.  ROS: Denies fever chills nausea vomiting muscle aches pains calf pain back pain chest pain shortness of breath headache.  Denies any change in his medications allergies surgeries and social history.  Objective: Vital signs are stable he is alert and oriented x3.  Pulses are palpable right foot minimally palpable left foot capillary fill time is sluggish left foot.  Left leg is cool to the touch compared to the right leg to the level of the thigh.  Radiographs taken today demonstrate fifth metatarsal osteotomy with screw fixation appears to still be intact there is some soft tissue swelling around the area.  It does not appear to be any bony involvement.  No purulence no malodor from the wound today.  I did try to debride it to see if there was any abscess present but it was too painful for the patient to tolerate.  There appears to be some cellulitic streaking to the level of the mid metatarsal.  Assessment: Cellulitis left foot secondary to skin breakdown most likely associated with peripheral vascular disease of the left leg and foot.  Plan: Both of his fifth metatarsals were surgically corrected and were doing very well with absolutely no pain for about 2 years.  He has now suddenly experiencing pain with skin breakdown on the left.  None on the right.  At this point it seems to be a vascular issue.  We are going to send him for vascular evaluation stat I also put him on Augmentin  875 mg twice daily for 10 days continue the use of his surgical shoe and I will follow-up with him in 1 week.  I expressed to him that should he develop fever chills nausea vomiting muscle aches pains calf pain or an increase in the streaking he is to immediately go to the emergency department.

## 2019-08-03 ENCOUNTER — Other Ambulatory Visit: Payer: Self-pay | Admitting: Primary Care

## 2019-08-04 ENCOUNTER — Ambulatory Visit: Payer: BLUE CROSS/BLUE SHIELD | Admitting: Podiatry

## 2019-08-04 ENCOUNTER — Ambulatory Visit: Payer: BC Managed Care – PPO | Admitting: Podiatry

## 2019-08-21 ENCOUNTER — Other Ambulatory Visit: Payer: Self-pay | Admitting: Primary Care

## 2019-08-21 DIAGNOSIS — E119 Type 2 diabetes mellitus without complications: Secondary | ICD-10-CM

## 2019-08-21 DIAGNOSIS — Z794 Long term (current) use of insulin: Secondary | ICD-10-CM

## 2019-08-21 DIAGNOSIS — I10 Essential (primary) hypertension: Secondary | ICD-10-CM

## 2019-08-21 DIAGNOSIS — E785 Hyperlipidemia, unspecified: Secondary | ICD-10-CM

## 2019-08-21 DIAGNOSIS — Z1159 Encounter for screening for other viral diseases: Secondary | ICD-10-CM

## 2019-08-27 ENCOUNTER — Encounter: Payer: Self-pay | Admitting: Podiatry

## 2019-08-27 ENCOUNTER — Ambulatory Visit: Payer: BC Managed Care – PPO | Admitting: Podiatry

## 2019-08-27 ENCOUNTER — Other Ambulatory Visit: Payer: Self-pay

## 2019-08-27 ENCOUNTER — Encounter

## 2019-08-27 ENCOUNTER — Telehealth: Payer: Self-pay

## 2019-08-27 DIAGNOSIS — L089 Local infection of the skin and subcutaneous tissue, unspecified: Secondary | ICD-10-CM

## 2019-08-27 DIAGNOSIS — L97521 Non-pressure chronic ulcer of other part of left foot limited to breakdown of skin: Secondary | ICD-10-CM | POA: Diagnosis not present

## 2019-08-27 MED ORDER — TRAMADOL HCL 50 MG PO TABS: 50.0000 mg | ORAL_TABLET | Freq: Two times a day (BID) | ORAL | 0 refills | Status: AC

## 2019-08-27 MED ORDER — DOXYCYCLINE HYCLATE 100 MG PO TABS: 100.0000 mg | ORAL_TABLET | Freq: Two times a day (BID) | ORAL | 0 refills | Status: DC

## 2019-08-27 NOTE — Telephone Encounter (Signed)
LVM w COVID screen, front door and back lab info 12.30.2020 TLJ

## 2019-08-27 NOTE — Progress Notes (Signed)
He presents today for follow-up of cellulitis to the lateral aspect of the left foot.  He states that his toes and his foot look better.  He states that a day or 2 after his vascular evaluation his toes turned splotchy with red and purple lesions.  He also has an area on his left heel that is this way.  The only area that is painful for him however is the initial area fifth met left.  Objective: Vital signs are stable he is alert and oriented x3.  Pulses are palpable left foot is cold to the touch toes are cyanotic with areas of purpura even on the heel there is a small area of purpura they are nonblanching it appears to be vascular in nature.  The initial area of skin breakdown overlying the fifth metatarsal is healing but appears to be healing very slowly currently there are no open wounds.  Assessment it appears to be some type of vasculitis cannot rule out Covid toe or vasculitis associated with Covid.  Also cannot rule out a shower embolus though the contralateral foot does not demonstrate this.  Plan: Start him on an adult aspirin warm not hot compresses or warm bath for the toes.  I am also going to request a CBC and a CMP.  I placed him in a cam walker.  I will to follow-up with him in 2 weeks and I will discuss photographs taken in the office today with my staff and peers tomorrow.  I also started him on doxycycline.

## 2019-08-28 ENCOUNTER — Telehealth: Payer: Self-pay

## 2019-08-28 ENCOUNTER — Other Ambulatory Visit: Payer: Self-pay | Admitting: Podiatry

## 2019-08-28 DIAGNOSIS — L97521 Non-pressure chronic ulcer of other part of left foot limited to breakdown of skin: Secondary | ICD-10-CM

## 2019-08-28 DIAGNOSIS — L089 Local infection of the skin and subcutaneous tissue, unspecified: Secondary | ICD-10-CM

## 2019-08-28 NOTE — Telephone Encounter (Signed)
-----   Message from Rip Harbour, [facility] sent at 08/28/2019 10:12 AM EST -----  ----- Message ----- From: Garrel Ridgel, DPM Sent: 08/28/2019   7:29 AM EST To: Rip Harbour, PMAC  Caryl Pina could you please make an appointment with Dr. Donnella Bi for Everlean Alstrom.  I have sent Dr. Alvester Chou a note and hopefully he will be able to see Mr. Tipton in the near future.  I need this to be done soon as possible.  Thank you

## 2019-08-28 NOTE — Telephone Encounter (Signed)
Referral has been entered in chart and sent to Dr. Adora Fridge.   Patient has been notified of referral

## 2019-08-29 LAB — CBC WITH DIFFERENTIAL/PLATELET
Basophils Absolute: 0 10*3/uL (ref 0.0–0.2)
Basos: 0 %
EOS (ABSOLUTE): 0.1 10*3/uL (ref 0.0–0.4)
Eos: 2 %
Hematocrit: 49.6 % (ref 37.5–51.0)
Hemoglobin: 17.3 g/dL (ref 13.0–17.7)
Immature Grans (Abs): 0 10*3/uL (ref 0.0–0.1)
Immature Granulocytes: 0 %
Lymphocytes Absolute: 1.2 10*3/uL (ref 0.7–3.1)
Lymphs: 25 %
MCH: 33.9 pg — ABNORMAL HIGH (ref 26.6–33.0)
MCHC: 34.9 g/dL (ref 31.5–35.7)
MCV: 97 fL (ref 79–97)
Monocytes Absolute: 0.7 10*3/uL (ref 0.1–0.9)
Monocytes: 14 %
Neutrophils Absolute: 2.8 10*3/uL (ref 1.4–7.0)
Neutrophils: 59 %
Platelets: 214 10*3/uL (ref 150–450)
RBC: 5.11 x10E6/uL (ref 4.14–5.80)
RDW: 12.2 % (ref 11.6–15.4)
WBC: 4.9 10*3/uL (ref 3.4–10.8)

## 2019-08-29 LAB — CMP14+EGFR
ALT: 46 IU/L — ABNORMAL HIGH (ref 0–44)
AST: 40 IU/L (ref 0–40)
Albumin/Globulin Ratio: 1.7 (ref 1.2–2.2)
Albumin: 4.7 g/dL (ref 3.8–4.9)
Alkaline Phosphatase: 81 IU/L (ref 39–117)
BUN/Creatinine Ratio: 11 (ref 9–20)
BUN: 9 mg/dL (ref 6–24)
Bilirubin Total: 0.8 mg/dL (ref 0.0–1.2)
CO2: 24 mmol/L (ref 20–29)
Calcium: 9.4 mg/dL (ref 8.7–10.2)
Chloride: 95 mmol/L — ABNORMAL LOW (ref 96–106)
Creatinine, Ser: 0.84 mg/dL (ref 0.76–1.27)
GFR calc Af Amer: 111 mL/min/{1.73_m2} (ref >59)
GFR calc non Af Amer: 96 mL/min/{1.73_m2} (ref >59)
Globulin, Total: 2.7 g/dL (ref 1.5–4.5)
Glucose: 100 mg/dL — ABNORMAL HIGH (ref 65–99)
Potassium: 4.8 mmol/L (ref 3.5–5.2)
Sodium: 135 mmol/L (ref 134–144)
Total Protein: 7.4 g/dL (ref 6.0–8.5)

## 2019-09-01 ENCOUNTER — Other Ambulatory Visit (INDEPENDENT_AMBULATORY_CARE_PROVIDER_SITE_OTHER): Payer: BC Managed Care – PPO

## 2019-09-01 ENCOUNTER — Telehealth: Payer: Self-pay

## 2019-09-01 ENCOUNTER — Other Ambulatory Visit: Payer: Self-pay

## 2019-09-01 DIAGNOSIS — E785 Hyperlipidemia, unspecified: Secondary | ICD-10-CM | POA: Diagnosis not present

## 2019-09-01 DIAGNOSIS — Z1159 Encounter for screening for other viral diseases: Secondary | ICD-10-CM

## 2019-09-01 DIAGNOSIS — E119 Type 2 diabetes mellitus without complications: Secondary | ICD-10-CM

## 2019-09-01 DIAGNOSIS — Z794 Long term (current) use of insulin: Secondary | ICD-10-CM

## 2019-09-01 LAB — LIPID PANEL
Cholesterol: 238 mg/dL — ABNORMAL HIGH (ref 0–200)
HDL: 69.7 mg/dL (ref >39.00)
LDL Cholesterol: 144 mg/dL — ABNORMAL HIGH (ref 0–99)
NonHDL: 168.23
Total CHOL/HDL Ratio: 3
Triglycerides: 119 mg/dL (ref 0.0–149.0)
VLDL: 23.8 mg/dL (ref 0.0–40.0)

## 2019-09-01 LAB — HEMOGLOBIN A1C: Hgb A1c MFr Bld: 5.5 % (ref 4.6–6.5)

## 2019-09-01 NOTE — Telephone Encounter (Signed)
-----   Message from Lorretta Harp, MD sent at 08/29/2019 10:16 AM EST ----- Samuel Heath, please make sure I see this pt next week in the office for critical limb ischemia.  Thx, JJB ----- Message ----- From: Cedric Fishman Sent: 08/28/2019   7:12 AM EST To: Lorretta Harp, MD  Good morning Samuel Heath, I hope you and your family are well.  I will be referring a this patient to you, Samuel Heath.  I saw him three weeks ago as he presented with a superficial ulcer with cellulitis to his fifth metatarsal head area left foot. Palpable but decreased pulses left compared to right.  Left leg was extremely cold compare to the right.  I treated with antibiotics and sent for vascular studies. ABI's were normal.  Since that time he has begun to heal the ulceration and cellulitis has resolved.  He has now developed what appears to be "trash toes" with combination of cyanosis and erythema with non blanching petechial lesions to all of the toes.  He also has cyanosis to his heel pad with a central erythematous lesion.He does not demonstrate similar findings to the right foot.  I feel that this may be some type of embolic process possibly from vascular study itself or of cardiac origin.  He has been negative for Covid otherwise I would think possible "Covid toe".Could you please take a look at him and render an opinion.  I will have my staff make an appointment for him as soon as you are available.I appreciate value your opinionHappy new year.[employer]

## 2019-09-01 NOTE — Telephone Encounter (Signed)
Called pt and made appt for 1/5

## 2019-09-02 ENCOUNTER — Encounter: Payer: Self-pay | Admitting: Cardiovascular Disease

## 2019-09-02 ENCOUNTER — Other Ambulatory Visit: Payer: Self-pay

## 2019-09-02 ENCOUNTER — Ambulatory Visit: Payer: BC Managed Care – PPO | Admitting: Cardiovascular Disease

## 2019-09-02 DIAGNOSIS — E782 Mixed hyperlipidemia: Secondary | ICD-10-CM | POA: Diagnosis not present

## 2019-09-02 DIAGNOSIS — I2510 Atherosclerotic heart disease of native coronary artery without angina pectoris: Secondary | ICD-10-CM

## 2019-09-02 DIAGNOSIS — I10 Essential (primary) hypertension: Secondary | ICD-10-CM | POA: Diagnosis not present

## 2019-09-02 DIAGNOSIS — L97529 Non-pressure chronic ulcer of other part of left foot with unspecified severity: Secondary | ICD-10-CM | POA: Insufficient documentation

## 2019-09-02 HISTORY — DX: Non-pressure chronic ulcer of other part of left foot with unspecified severity: L97.529

## 2019-09-02 LAB — HEPATITIS C ANTIBODY
Hepatitis C Ab: NONREACTIVE
SIGNAL TO CUT-OFF: 0.02 (ref <1.00)

## 2019-09-02 NOTE — Patient Instructions (Signed)
Medication Instructions:  Your physician recommends that you continue on your current medications as directed. Please refer to the Current Medication list given to you today.  If you need a refill on your cardiac medications before your next appointment, please call your pharmacy.   Lab work: NONE  Testing/Procedures: NONE  Follow-Up: At [facility], you and your health needs are our priority.  As part of our continuing mission to provide you with exceptional heart care, we have created designated Provider Care Teams.  These Care Teams include your primary Cardiologist (physician) and Advanced Practice Providers (APPs -  Physician Assistants and Nurse Practitioners) who all work together to provide you with the care you need, when you need it. You may see Dr Gwenlyn Found or one of the following Advanced Practice Providers on your designated Care Team:    Kerin Ransom, PA-C  [place], [place]  Coletta Memos, [place]  Your physician wants you to follow-up in: as needed

## 2019-09-02 NOTE — Assessment & Plan Note (Signed)
Healing ulcer lateral aspect of left foot.  He has had his foot operated on both the right and left side in 2018.  He denies claudication.  He had recent lower extremity arterial Doppler study/ABIs performed 08/01/2019 which were entirely normal.  He also saw Dr. Donzetta Matters , vascular surgery, who also felt that this was nonvascular and probably related to pressure from his boot.  Since offloading his ulcer is healing.

## 2019-09-02 NOTE — Assessment & Plan Note (Signed)
History of hyperlipidemia intolerant to statin therapy 

## 2019-09-02 NOTE — Assessment & Plan Note (Signed)
Patient essential potential blood pressure measured today 162/105.  He is on losartan and hydrochlorothiazide

## 2019-09-02 NOTE — Progress Notes (Signed)
09/02/2019 Samuel Heath   1960-10-05  [id number]  Primary Physician Samuel Koch, NP Primary Cardiologist: Samuel Harp MD Samuel Heath, [place], [place]  HPI:  Samuel Heath is a 59 y.o. moderately overweight married Caucasian male father of 2 sons is accompanied by his wife Samuel Heath today.  He was referred by Dr. Milinda Heath, his podiatrist, for peripheral vascular valuation because of a slow healing wound to the lateral aspect of his left foot.  His cardiac risk factors include type 2 diabetes, essential hypertension and hyperlipidemia intolerant to statin therapy.  He has not obstructive CAD by remote cath.  He is never had a heart attack or stroke.  He denies chest pain or shortness of breath.  He denies claudication as well.  He developed a wound to the lateral aspect of his left foot which is slowly healing well with offloading.  Was thought related that it was related to trauma from tightly fitting boot.  He did have Doppler studies performed 08/01/2027 which revealed normal ABIs with triphasic waveforms.   Current Meds  Medication Sig  . aspirin 325 MG tablet Take 325 mg by mouth.   . Cyanocobalamin (VITAMIN B 12 PO) Take 1 tablet by mouth daily. Reported on 12/09/2015  . doxycycline (VIBRA-TABS) 100 MG tablet Take 1 tablet (100 mg total) by mouth 2 (two) times daily.  Marland Kitchen gabapentin (NEURONTIN) 100 MG capsule Take 2 capsules by mouth at bedtime for restless legs.  . hydrochlorothiazide (HYDRODIURIL) 12.5 MG tablet TAKE 1 TABLET BY MOUTH ONCE DAILY. APPOINTMENT NEEDED FOR FURTHER REFILLS.  Marland Kitchen losartan (COZAAR) 100 MG tablet Take 1 tablet by mouth once daily for blood pressure  . metFORMIN (GLUCOPHAGE) 500 MG tablet TAKE 1 TABLET BY MOUTH TWICE DAILY AS NEEDED WITH A MEAL FOR DIABETES  . Multiple Vitamins-Minerals (PX COMPLETE SENIOR MULTIVITS) TABS Take by mouth.  Marland Kitchen omeprazole (PRILOSEC) 20 MG capsule Take 20 mg by mouth daily.  . phentermine 37.5 MG capsule Take 37.5 mg by mouth every  morning.  . traMADol (ULTRAM) 50 MG tablet Take 1 tablet (50 mg total) by mouth 2 (two) times daily for 7 days.   Current Facility-Administered Medications for the 09/02/19 encounter (Office Visit) with Samuel Harp, MD  Medication  . betamethasone acetate-betamethasone sodium phosphate (CELESTONE) injection 12 mg     No Known Allergies  Social History   Socioeconomic History  . Marital status: Married    Spouse name: Not on file  . Number of children: Not on file  . Years of education: Not on file  . Highest education level: Not on file  Occupational History  . 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 Smoking status: Never Smoker  . Smokeless tobacco: Former Systems developer  . Tobacco comment: Denies tobacco use  Substance and Sexual Activity  . Alcohol use: Yes    Alcohol/week: 60.0 standard drinks    Types: 60 Cans of beer per week    Comment: More than a 12-pack of beer a day.10-20-15 now down to 6 beers /day  . Drug use: No  . Sexual activity: Not on file  Other Topics Concern  . Not on file  Social History Narrative   Married.   2 children.   Works as a Administrator.   Enjoys going to [employer], antiquing.    Social Determinants of Health   Financial Resource Strain:   . Difficulty of Paying Living Expenses: Not  on file  Food Insecurity:   . Worried About Charity fundraiser in the Last Year: Not on file  . Ran Out of Food in the Last Year: Not on file  Transportation Needs:   . Lack of Transportation (Medical): Not on file  . Lack of Transportation (Non-Medical): Not on file  Physical Activity:   . Days of Exercise per Week: Not on file  . Minutes of Exercise per Session: Not on file  Stress:   . Feeling of Stress : Not on file  Social Connections:   . Frequency of Communication with Friends and Family: Not on file  . Frequency of Social Gatherings with Friends and Family: Not on file  . Attends Religious Services: Not on file   . Active Member of Clubs or Organizations: Not on file  . Attends Archivist Meetings: Not on file  . Marital Status: Not on file  Intimate Partner Violence:   . Fear of Current or Ex-Partner: Not on file  . Emotionally Abused: Not on file  . Physically Abused: Not on file  . Sexually Abused: Not on file     Review of Systems: General: negative for chills, fever, night sweats or weight changes.  Cardiovascular: negative for chest pain, dyspnea on exertion, edema, orthopnea, palpitations, paroxysmal nocturnal dyspnea or shortness of breath Dermatological: negative for rash Respiratory: negative for cough or wheezing Urologic: negative for hematuria Abdominal: negative for nausea, vomiting, diarrhea, bright red blood per rectum, melena, or hematemesis Neurologic: negative for visual changes, syncope, or dizziness All other systems reviewed and are otherwise negative except as noted above.    Blood pressure (!) 162/105, pulse 84, height 5\' 10"  (1.778 m), weight 246 lb (111.6 kg), SpO2 97 %.  General appearance: alert and no distress Neck: no adenopathy, no carotid bruit, no JVD, supple, symmetrical, trachea midline and thyroid not enlarged, symmetric, no tenderness/mass/nodules Lungs: clear to auscultation bilaterally Heart: regular rate and rhythm, S1, S2 normal, no murmur, click, rub or gallop Extremities: Mildly cyanotic distal aspects of left foot Pulses: 2+ and symmetric Skin: Healing ulcer lateral aspect of left foot Neurologic: Alert and oriented X 3, normal strength and tone. Normal symmetric reflexes. Normal coordination and gait  EKG sinus rhythm at 84 with left axis deviation.  I personally reviewed this EKG.  ASSESSMENT AND PLAN:   Hyperlipidemia History of hyperlipidemia intolerant to statin therapy  Essential hypertension Patient essential potential blood pressure measured today 162/105.  He is on losartan and hydrochlorothiazide  CAD (coronary artery  disease) History of CAD status post coronary calcium score is mildly elevated leading to cardiac cath remotely that showed nonobstructive CAD.  Patient is very active and denies chest pain or shortness of breath.  Chronic ulcer of left foot ([place]) Healing ulcer lateral aspect of left foot.  He has had his foot operated on both the right and left side in 2018.  He denies claudication.  He had recent lower extremity arterial Doppler study/ABIs performed 08/01/2019 which were entirely normal.  He also saw Dr. Donzetta Matters , vascular surgery, who also felt that this was nonvascular and probably related to pressure from his boot.  Since offloading his ulcer is healing.      Samuel Harp MD FACP,FACC,FAHA, [facility] 09/02/2019 8:45 AM

## 2019-09-02 NOTE — Assessment & Plan Note (Signed)
History of CAD status post coronary calcium score is mildly elevated leading to cardiac cath remotely that showed nonobstructive CAD.  Patient is very active and denies chest pain or shortness of breath.

## 2019-09-03 ENCOUNTER — Ambulatory Visit (INDEPENDENT_AMBULATORY_CARE_PROVIDER_SITE_OTHER): Payer: BC Managed Care – PPO | Admitting: Primary Care

## 2019-09-03 ENCOUNTER — Encounter: Payer: Self-pay | Admitting: Primary Care

## 2019-09-03 VITALS — BP 150/90 | HR 90 | Temp 96.6°F | Ht 70.0 in | Wt 247.5 lb

## 2019-09-03 DIAGNOSIS — Z0000 Encounter for general adult medical examination without abnormal findings: Secondary | ICD-10-CM | POA: Diagnosis not present

## 2019-09-03 DIAGNOSIS — E119 Type 2 diabetes mellitus without complications: Secondary | ICD-10-CM | POA: Diagnosis not present

## 2019-09-03 DIAGNOSIS — L97529 Non-pressure chronic ulcer of other part of left foot with unspecified severity: Secondary | ICD-10-CM

## 2019-09-03 DIAGNOSIS — I2510 Atherosclerotic heart disease of native coronary artery without angina pectoris: Secondary | ICD-10-CM

## 2019-09-03 DIAGNOSIS — G2581 Restless legs syndrome: Secondary | ICD-10-CM

## 2019-09-03 DIAGNOSIS — E785 Hyperlipidemia, unspecified: Secondary | ICD-10-CM

## 2019-09-03 DIAGNOSIS — Z23 Encounter for immunization: Secondary | ICD-10-CM

## 2019-09-03 DIAGNOSIS — F1010 Alcohol abuse, uncomplicated: Secondary | ICD-10-CM

## 2019-09-03 DIAGNOSIS — I10 Essential (primary) hypertension: Secondary | ICD-10-CM

## 2019-09-03 DIAGNOSIS — Z0001 Encounter for general adult medical examination with abnormal findings: Secondary | ICD-10-CM | POA: Insufficient documentation

## 2019-09-03 MED ORDER — METFORMIN HCL 500 MG PO TABS: ORAL_TABLET | ORAL | 3 refills | Status: DC

## 2019-09-03 MED ORDER — HYDROCHLOROTHIAZIDE 12.5 MG PO TABS: ORAL_TABLET | ORAL | 3 refills | Status: DC

## 2019-09-03 MED ORDER — ROSUVASTATIN CALCIUM 10 MG PO TABS: 10.0000 mg | ORAL_TABLET | Freq: Every evening | ORAL | 3 refills | Status: DC

## 2019-09-03 MED ORDER — LOSARTAN POTASSIUM 100 MG PO TABS: 100.0000 mg | ORAL_TABLET | Freq: Every day | ORAL | 3 refills | Status: DC

## 2019-09-03 MED ORDER — GABAPENTIN 100 MG PO CAPS: ORAL_CAPSULE | ORAL | 0 refills | Status: DC

## 2019-09-03 NOTE — Assessment & Plan Note (Signed)
Drinking 15, twelve ounce beers nightly. Encouraged to cut back. ALT slightly elevated, will monitor.

## 2019-09-03 NOTE — Assessment & Plan Note (Signed)
Doing well on PRN gabapentin, continue same. Refill provided.

## 2019-09-03 NOTE — Assessment & Plan Note (Signed)
LDL above goal, ASCVD risk score of 20%. Patient endorses tolerance to statin therapy, was once on pravastatin for several years without problems.  Trial of Crestor. Repeat lipids and LFT's in 2 months.

## 2019-09-03 NOTE — Assessment & Plan Note (Signed)
Asymptomatic, recently evaluated by cardiology. Initiated statin therapy, will trial Crestor given history of myalgias.

## 2019-09-03 NOTE — Patient Instructions (Signed)
Start rosuvastatin (Crestor) 10 mg once daily for high cholesterol and prevention of heart disease.   Continue exercising. You should be getting 150 minutes of moderate intensity exercise weekly.  Continue to work on a healthy diet. Ensure you are consuming 64 ounces of water daily.  Reduce alcohol consumption.   Please notify me if you change your mind regarding the colonoscopy.  Schedule a nurse visit for two weeks for blood pressure check. Schedule a lab only appointment for 2 months for cholesterol check.  It was a pleasure to see you today!   Preventive Care 8-74 Years Old, Male Preventive care refers to lifestyle choices and visits with your health care provider that can promote health and wellness. This includes:  A yearly physical exam. This is also called an annual well check.  Regular dental and eye exams.  Immunizations.  Screening for certain conditions.  Healthy lifestyle choices, such as eating a healthy diet, getting regular exercise, not using drugs or products that contain nicotine and tobacco, and limiting alcohol use. What can I expect for my preventive care visit? Physical exam Your health care provider will check:  Height and weight. These may be used to calculate body mass index (BMI), which is a measurement that tells if you are at a healthy weight.  Heart rate and blood pressure.  Your skin for abnormal spots. Counseling Your health care provider may ask you questions about:  Alcohol, tobacco, and drug use.  Emotional well-being.  Home and relationship well-being.  Sexual activity.  Eating habits.  Work and work Statistician. What immunizations do I need?  Influenza (flu) vaccine  This is recommended every year. Tetanus, diphtheria, and pertussis (Tdap) vaccine  You may need a Td booster every 10 years. Varicella (chickenpox) vaccine  You may need this vaccine if you have not already been vaccinated. Zoster (shingles)  vaccine  You may need this after age 65. Measles, mumps, and rubella (MMR) vaccine  You may need at least one dose of MMR if you were born in 1957 or later. You may also need a second dose. Pneumococcal conjugate (PCV13) vaccine  You may need this if you have certain conditions and were not previously vaccinated. Pneumococcal polysaccharide (PPSV23) vaccine  You may need one or two doses if you smoke cigarettes or if you have certain conditions. Meningococcal conjugate (MenACWY) vaccine  You may need this if you have certain conditions. Hepatitis A vaccine  You may need this if you have certain conditions or if you travel or work in places where you may be exposed to hepatitis A. Hepatitis B vaccine  You may need this if you have certain conditions or if you travel or work in places where you may be exposed to hepatitis B. Haemophilus influenzae type b (Hib) vaccine  You may need this if you have certain risk factors. Human papillomavirus (HPV) vaccine  If recommended by your health care provider, you may need three doses over 6 months. You may receive vaccines as individual doses or as more than one vaccine together in one shot (combination vaccines). Talk with your health care provider about the risks and benefits of combination vaccines. What tests do I need? Blood tests  Lipid and cholesterol levels. These may be checked every 5 years, or more frequently if you are over 48 years old.  Hepatitis C test.  Hepatitis B test. Screening  Lung cancer screening. You may have this screening every year starting at age 56 if you have a 30-pack-year  history of smoking and currently smoke or have quit within the past 15 years.  Prostate cancer screening. Recommendations will vary depending on your family history and other risks.  Colorectal cancer screening. All adults should have this screening starting at age 33 and continuing until age 62. Your health care provider may recommend  screening at age 44 if you are at increased risk. You will have tests every 1-10 years, depending on your results and the type of screening test.  Diabetes screening. This is done by checking your blood sugar (glucose) after you have not eaten for a while (fasting). You may have this done every 1-3 years.  Sexually transmitted disease (STD) testing. Follow these instructions at home: Eating and drinking  Eat a diet that includes fresh fruits and vegetables, whole grains, lean protein, and low-fat dairy products.  Take vitamin and mineral supplements as recommended by your health care provider.  Do not drink alcohol if your health care provider tells you not to drink.  If you drink alcohol: ? Limit how much you have to 0-2 drinks a day. ? Be aware of how much alcohol is in your drink. In the [place] one drink equals one 12 oz bottle of beer (355 mL), one 5 oz glass of wine (148 mL), or one 1 oz glass of hard liquor (44 mL). Lifestyle  Take daily care of your teeth and gums.  Stay active. Exercise for at least 30 minutes on 5 or more days each week.  Do not use any products that contain nicotine or tobacco, such as cigarettes, e-cigarettes, and chewing tobacco. If you need help quitting, ask your health care provider.  If you are sexually active, practice safe sex. Use a condom or other form of protection to prevent STIs (sexually transmitted infections).  Talk with your health care provider about taking a low-dose aspirin every day starting at age 20. What's next?  Go to your health care provider once a year for a well check visit.  Ask your health care provider how often you should have your eyes and teeth checked.  Stay up to date on all vaccines. This information is not intended to replace advice given to you by your health care provider. Make sure you discuss any questions you have with your health care provider. Document Revised: 08/08/2018 Document Reviewed:  08/08/2018 Elsevier Patient Education  2020 [employer].

## 2019-09-03 NOTE — Assessment & Plan Note (Signed)
Well controlled on current regimen. Discussed option to stop metformin and monitor, he and his wife would like to continue given chronic foot ulcer, agreed.  Statin therapy initiated given LDL and history of CAD. Declines pneumonia vaccination. Managed on ARB, refill provided. Following with podiatry. Discussed to schedule eye exam.  Follow up in 6-12 months.

## 2019-09-03 NOTE — Assessment & Plan Note (Signed)
Following with both podiatry and cardiology.

## 2019-09-03 NOTE — Assessment & Plan Note (Signed)
Tetanus UTD per patient, influenza vaccination provided. Declined pneumonia and shingles vaccines.  PSA UTD. Colonoscopy overdue, he declines despite recommendations. Commended him on lifestyle changes, encouraged to continue. Exam today stable. Labs reviewed.
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
## 2019-09-03 NOTE — Assessment & Plan Note (Signed)
Above goal in the office today, has been without losartan since Summer 2020. Refills provided for both HCTZ and losartan. CMP reviewed.  Nurse visit in 2 weeks for BP check.

## 2019-09-04 ENCOUNTER — Telehealth: Payer: Self-pay | Admitting: *Deleted

## 2019-09-04 NOTE — Telephone Encounter (Signed)
-----   Message from Garrel Ridgel, [place] sent at 09/02/2019  7:10 AM EST ----- Blood work does not demonstrate any kind of infection or blood abnormality.

## 2019-09-04 NOTE — Addendum Note (Signed)
Addended by: Jacqualin Combes on: 09/04/2019 12:44 PM   Modules accepted: Orders

## 2019-09-04 NOTE — Telephone Encounter (Signed)
Left message requesting call back to discuss labs results.

## 2019-09-05 ENCOUNTER — Other Ambulatory Visit (INDEPENDENT_AMBULATORY_CARE_PROVIDER_SITE_OTHER): Payer: BC Managed Care – PPO

## 2019-09-05 DIAGNOSIS — I739 Peripheral vascular disease, unspecified: Secondary | ICD-10-CM | POA: Diagnosis not present

## 2019-09-05 DIAGNOSIS — E782 Mixed hyperlipidemia: Secondary | ICD-10-CM | POA: Diagnosis not present

## 2019-09-05 DIAGNOSIS — I10 Essential (primary) hypertension: Secondary | ICD-10-CM | POA: Diagnosis not present

## 2019-09-05 DIAGNOSIS — I2510 Atherosclerotic heart disease of native coronary artery without angina pectoris: Secondary | ICD-10-CM | POA: Diagnosis not present

## 2019-09-05 NOTE — Telephone Encounter (Signed)
I informed pt of Dr. Milinda Pointer review of results.

## 2019-09-05 NOTE — Telephone Encounter (Signed)
Pt called for results.

## 2019-09-10 ENCOUNTER — Other Ambulatory Visit: Payer: Self-pay

## 2019-09-10 ENCOUNTER — Encounter: Payer: Self-pay | Admitting: Podiatry

## 2019-09-10 ENCOUNTER — Ambulatory Visit: Payer: BC Managed Care – PPO | Admitting: Podiatry

## 2019-09-10 DIAGNOSIS — I739 Peripheral vascular disease, unspecified: Secondary | ICD-10-CM | POA: Diagnosis not present

## 2019-09-10 DIAGNOSIS — L97521 Non-pressure chronic ulcer of other part of left foot limited to breakdown of skin: Secondary | ICD-10-CM

## 2019-09-10 NOTE — Progress Notes (Signed)
He presents today for follow-up of the ulcerations in the ischemic lesions to his toes.  He states that his toes and his foot seem to be doing better but his heel is becoming more more painful as time goes on.  He states that he can hardly stand to even bear weight down on his heel.  His wife continues to dress the wound daily.  He saw Dr. Donnella Bi who said that there was nothing wrong with the circulation.  Objective: Vital signs are stable alert and oriented x3.  Pulses are palpable.  His lesions to the toes are healing up and his other wounds are healing up he does still have that area of what appeared to be ischemic damage to the heel pad left.  This is the area that is most tender and is tender on the skin but not as deep down in the heel.  Assessment: I believe that he had some type of embolic process after his noninvasive vascular studies.  Wounds appear to be healing.  He also has dropfoot to the left foot.  Plan: He will continue treatment of his left foot daily dressing changes.  I will have him back next Wednesday I would like to see him again at that time but he will follow-up with Liliane Channel as well for an AFO for his dropfoot left and an orthosis.

## 2019-09-16 ENCOUNTER — Telehealth: Payer: Self-pay

## 2019-09-16 NOTE — Telephone Encounter (Signed)
Pt has [facility] apt for bp check on 1/21. Need to screen for covid symptoms. LVM

## 2019-09-17 ENCOUNTER — Other Ambulatory Visit: Payer: Self-pay

## 2019-09-17 ENCOUNTER — Ambulatory Visit (INDEPENDENT_AMBULATORY_CARE_PROVIDER_SITE_OTHER): Payer: BC Managed Care – PPO | Admitting: Orthotics

## 2019-09-17 DIAGNOSIS — L02619 Cutaneous abscess of unspecified foot: Secondary | ICD-10-CM

## 2019-09-17 DIAGNOSIS — M779 Enthesopathy, unspecified: Secondary | ICD-10-CM

## 2019-09-17 DIAGNOSIS — L089 Local infection of the skin and subcutaneous tissue, unspecified: Secondary | ICD-10-CM

## 2019-09-17 DIAGNOSIS — L03119 Cellulitis of unspecified part of limb: Secondary | ICD-10-CM

## 2019-09-17 DIAGNOSIS — L97521 Non-pressure chronic ulcer of other part of left foot limited to breakdown of skin: Secondary | ICD-10-CM

## 2019-09-17 DIAGNOSIS — M21622 Bunionette of left foot: Secondary | ICD-10-CM

## 2019-09-17 DIAGNOSIS — M21621 Bunionette of right foot: Secondary | ICD-10-CM

## 2019-09-18 ENCOUNTER — Ambulatory Visit: Payer: BC Managed Care – PPO

## 2019-09-23 NOTE — Progress Notes (Addendum)
Patient presents today for evaluation/casting for AFO brace (L).   Patient has hx of the following conditions: Gait instability,  Ankle instabilty,  Gait analysis done and patient displays abnormality of gait in both sagittial and frontal planes, and could benefit in aggressive ankle support.  Patient chose [place] brace w/ lace/speed laces.  

## 2019-09-24 ENCOUNTER — Ambulatory Visit: Payer: BC Managed Care – PPO | Admitting: Podiatry

## 2019-09-24 ENCOUNTER — Other Ambulatory Visit: Payer: Self-pay

## 2019-09-24 ENCOUNTER — Encounter: Payer: Self-pay | Admitting: Podiatry

## 2019-09-24 DIAGNOSIS — L97521 Non-pressure chronic ulcer of other part of left foot limited to breakdown of skin: Secondary | ICD-10-CM | POA: Diagnosis not present

## 2019-09-24 MED ORDER — GABAPENTIN 300 MG PO CAPS: 300.0000 mg | ORAL_CAPSULE | Freq: Three times a day (TID) | ORAL | 3 refills | Status: DC

## 2019-09-24 NOTE — Progress Notes (Signed)
He presents today for follow-up of his ulcers.  He states that he has burning shooting pain and that the ulceration seem to be doing a little bit better but still considerably painful.  Denies any new changes.  States that his blood sugars doing better.  States that he was given gabapentin but has not been taking it.  Objective: Vital signs are stable alert oriented x3.  Pulses are palpable.  Feet are cold to the touch ulceration subfifth and lateral fifth bilaterally appear to be healing over his left heel is still superficially ulcerated.  I examined his boots and shoes today his shoes are curved last use his boots are way too narrow for his foot and to show work hence the lesion on the back heel and the lateral fifth area.  Assessment I am encouraged that the ulcerative lesions are looking better.  He does have diabetic peripheral neuropathy.  Plan: Start him on gabapentin 300 mg 1 p.o. nightly at first progressing to 3 times a day.  I also highly recommended new shoes bigger wider boots new tennis shoes recommended new balance going to the new balance store in [place] to see about getting some new shoes.  I will follow-up with him in 2 to 4 weeks.  Should he call to discuss medication we will discuss that with him.

## 2019-10-22 ENCOUNTER — Ambulatory Visit: Payer: BC Managed Care – PPO | Admitting: Podiatry

## 2019-10-31 ENCOUNTER — Telehealth: Payer: Self-pay

## 2019-10-31 NOTE — Telephone Encounter (Signed)
LVM to call clinic, pt needs COVID screen and back lab info 3.5.2021 TLJ

## 2019-11-04 ENCOUNTER — Other Ambulatory Visit: Payer: BC Managed Care – PPO

## 2020-07-19 ENCOUNTER — Encounter: Payer: BC Managed Care – PPO | Admitting: Primary Care

## 2020-07-29 ENCOUNTER — Ambulatory Visit (INDEPENDENT_AMBULATORY_CARE_PROVIDER_SITE_OTHER): Payer: 59 | Admitting: Primary Care

## 2020-07-29 ENCOUNTER — Encounter: Payer: Self-pay | Admitting: Primary Care

## 2020-07-29 ENCOUNTER — Other Ambulatory Visit: Payer: Self-pay

## 2020-07-29 VITALS — BP 136/80 | HR 97 | Temp 98.1°F | Ht 69.0 in | Wt 216.0 lb

## 2020-07-29 DIAGNOSIS — G2581 Restless legs syndrome: Secondary | ICD-10-CM | POA: Diagnosis not present

## 2020-07-29 DIAGNOSIS — R7303 Prediabetes: Secondary | ICD-10-CM | POA: Diagnosis not present

## 2020-07-29 DIAGNOSIS — Z1211 Encounter for screening for malignant neoplasm of colon: Secondary | ICD-10-CM | POA: Diagnosis not present

## 2020-07-29 DIAGNOSIS — I2510 Atherosclerotic heart disease of native coronary artery without angina pectoris: Secondary | ICD-10-CM | POA: Diagnosis not present

## 2020-07-29 DIAGNOSIS — Z0000 Encounter for general adult medical examination without abnormal findings: Secondary | ICD-10-CM | POA: Diagnosis not present

## 2020-07-29 DIAGNOSIS — L97529 Non-pressure chronic ulcer of other part of left foot with unspecified severity: Secondary | ICD-10-CM

## 2020-07-29 DIAGNOSIS — F1010 Alcohol abuse, uncomplicated: Secondary | ICD-10-CM

## 2020-07-29 DIAGNOSIS — E785 Hyperlipidemia, unspecified: Secondary | ICD-10-CM | POA: Diagnosis not present

## 2020-07-29 DIAGNOSIS — Z125 Encounter for screening for malignant neoplasm of prostate: Secondary | ICD-10-CM | POA: Diagnosis not present

## 2020-07-29 DIAGNOSIS — R69 Illness, unspecified: Secondary | ICD-10-CM | POA: Diagnosis not present

## 2020-07-29 DIAGNOSIS — I10 Essential (primary) hypertension: Secondary | ICD-10-CM | POA: Diagnosis not present

## 2020-07-29 LAB — COMPREHENSIVE METABOLIC PANEL
ALT: 23 U/L (ref 0–53)
AST: 22 U/L (ref 0–37)
Albumin: 4.5 g/dL (ref 3.5–5.2)
Alkaline Phosphatase: 48 U/L (ref 39–117)
BUN: 17 mg/dL (ref 6–23)
CO2: 28 mEq/L (ref 19–32)
Calcium: 9.2 mg/dL (ref 8.4–10.5)
Chloride: 99 mEq/L (ref 96–112)
Creatinine, Ser: 0.71 mg/dL (ref 0.40–1.50)
GFR: 99.87 mL/min (ref >60.00)
Glucose, Bld: 107 mg/dL — ABNORMAL HIGH (ref 70–99)
Potassium: 4.4 mEq/L (ref 3.5–5.1)
Sodium: 136 mEq/L (ref 135–145)
Total Bilirubin: 0.6 mg/dL (ref 0.2–1.2)
Total Protein: 6.9 g/dL (ref 6.0–8.3)

## 2020-07-29 LAB — CBC
HCT: 45.8 % (ref 39.0–52.0)
Hemoglobin: 16.1 g/dL (ref 13.0–17.0)
MCHC: 35.2 g/dL (ref 30.0–36.0)
MCV: 99.7 fl (ref 78.0–100.0)
Platelets: 206 K/uL (ref 150.0–400.0)
RBC: 4.59 Mil/uL (ref 4.22–5.81)
RDW: 12.7 % (ref 11.5–15.5)
WBC: 4.1 K/uL (ref 4.0–10.5)

## 2020-07-29 LAB — LIPID PANEL
Cholesterol: 229 mg/dL — ABNORMAL HIGH (ref 0–200)
HDL: 73.8 mg/dL (ref >39.00)
LDL Cholesterol: 141 mg/dL — ABNORMAL HIGH (ref 0–99)
NonHDL: 155.55
Total CHOL/HDL Ratio: 3
Triglycerides: 72 mg/dL (ref 0.0–149.0)
VLDL: 14.4 mg/dL (ref 0.0–40.0)

## 2020-07-29 LAB — HEMOGLOBIN A1C: Hgb A1c MFr Bld: 5.3 % (ref 4.6–6.5)

## 2020-07-29 LAB — PSA: PSA: 0.44 ng/mL (ref 0.10–4.00)

## 2020-07-29 NOTE — Assessment & Plan Note (Signed)
Doing well on gabapentin 300 mg in AM and 600 mg HS. Continue same.

## 2020-07-29 NOTE — Assessment & Plan Note (Signed)
Chronic and continued, drinking 8-10 twelve ounce beers nightly. Encouraged cessation. Repeat CMP pending.

## 2020-07-29 NOTE — Assessment & Plan Note (Signed)
Asymptomatic. Continue statin therapy and aspirin. Repeat lipid panel pending.  Commended him on weight loss.

## 2020-07-29 NOTE — Assessment & Plan Note (Signed)
A1C from January 2021 normal. Commended him on weight loss efforts! Repeat A1C pending.  He does follow with podiatry for chronic foot numbness.

## 2020-07-29 NOTE — Assessment & Plan Note (Signed)
Resolved.  Following with podiatry for monitoring.

## 2020-07-29 NOTE — Assessment & Plan Note (Signed)
Improved compared to last visit, commended him on weight loss, recommended he discontinue phentermine. He is monitored by a weight loss center in [place].  Continue losartan 100 mg and HCTZ 12.5 mg. CMP pending.

## 2020-07-29 NOTE — Assessment & Plan Note (Signed)
Weight loss of 60+ pounds through dietary changes, increased activity, and phentermine. Discouraged use of phentermine given his history of hypertension and CAD. He follows with a weight loss clinic in [place].  Repeat lipids pending. Continue Crestor 10 mg.
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
## 2020-07-29 NOTE — Assessment & Plan Note (Signed)
Tetanus UTD, declines influenza and shingles vaccinations.  PSA due and pending. Colonoscopy overdue, he declines. He does opt for Cologuard.   Encouraged a continued healthy diet and regular exercise. Recommended cessation of alcohol.  Exam today stable Labs pending.

## 2020-07-29 NOTE — Patient Instructions (Addendum)
Stop by the lab prior to leaving today. I will notify you of your results once received.   Continue to work on a healthy diet. Ensure you are consuming 64 ounces of water daily.  Continue exercising. You should be getting 150 minutes of moderate intensity exercise weekly.  Complete the Cologuard Kit once received.   It was a pleasure to see you today!   Preventive Care 31-60 Years Old, Male Preventive care refers to lifestyle choices and visits with your health care provider that can promote health and wellness. This includes:  A yearly physical exam. This is also called an annual well check.  Regular dental and eye exams.  Immunizations.  Screening for certain conditions.  Healthy lifestyle choices, such as eating a healthy diet, getting regular exercise, not using drugs or products that contain nicotine and tobacco, and limiting alcohol use. What can I expect for my preventive care visit? Physical exam Your health care provider will check:  Height and weight. These may be used to calculate body mass index (BMI), which is a measurement that tells if you are at a healthy weight.  Heart rate and blood pressure.  Your skin for abnormal spots. Counseling Your health care provider may ask you questions about:  Alcohol, tobacco, and drug use.  Emotional well-being.  Home and relationship well-being.  Sexual activity.  Eating habits.  Work and work Statistician. What immunizations do I need?  Influenza (flu) vaccine  This is recommended every year. Tetanus, diphtheria, and pertussis (Tdap) vaccine  You may need a Td booster every 10 years. Varicella (chickenpox) vaccine  You may need this vaccine if you have not already been vaccinated. Zoster (shingles) vaccine  You may need this after age 82. Measles, mumps, and rubella (MMR) vaccine  You may need at least one dose of MMR if you were born in 1957 or later. You may also need a second dose. Pneumococcal  conjugate (PCV13) vaccine  You may need this if you have certain conditions and were not previously vaccinated. Pneumococcal polysaccharide (PPSV23) vaccine  You may need one or two doses if you smoke cigarettes or if you have certain conditions. Meningococcal conjugate (MenACWY) vaccine  You may need this if you have certain conditions. Hepatitis A vaccine  You may need this if you have certain conditions or if you travel or work in places where you may be exposed to hepatitis A. Hepatitis B vaccine  You may need this if you have certain conditions or if you travel or work in places where you may be exposed to hepatitis B. Haemophilus influenzae type b (Hib) vaccine  You may need this if you have certain risk factors. Human papillomavirus (HPV) vaccine  If recommended by your health care provider, you may need three doses over 6 months. You may receive vaccines as individual doses or as more than one vaccine together in one shot (combination vaccines). Talk with your health care provider about the risks and benefits of combination vaccines. What tests do I need? Blood tests  Lipid and cholesterol levels. These may be checked every 5 years, or more frequently if you are over 10 years old.  Hepatitis C test.  Hepatitis B test. Screening  Lung cancer screening. You may have this screening every year starting at age 30 if you have a 30-pack-year history of smoking and currently smoke or have quit within the past 15 years.  Prostate cancer screening. Recommendations will vary depending on your family history and other risks.  Colorectal cancer screening. All adults should have this screening starting at age 50 and continuing until age 75. Your health care provider may recommend screening at age 45 if you are at increased risk. You will have tests every 1-10 years, depending on your results and the type of screening test.  Diabetes screening. This is done by checking your blood sugar  (glucose) after you have not eaten for a while (fasting). You may have this done every 1-3 years.  Sexually transmitted disease (STD) testing. Follow these instructions at home: Eating and drinking  Eat a diet that includes fresh fruits and vegetables, whole grains, lean protein, and low-fat dairy products.  Take vitamin and mineral supplements as recommended by your health care provider.  Do not drink alcohol if your health care provider tells you not to drink.  If you drink alcohol: ? Limit how much you have to 0-2 drinks a day. ? Be aware of how much alcohol is in your drink. In the [place] one drink equals one 12 oz bottle of beer (355 mL), one 5 oz glass of wine (148 mL), or one 1 oz glass of hard liquor (44 mL). Lifestyle  Take daily care of your teeth and gums.  Stay active. Exercise for at least 30 minutes on 5 or more days each week.  Do not use any products that contain nicotine or tobacco, such as cigarettes, e-cigarettes, and chewing tobacco. If you need help quitting, ask your health care provider.  If you are sexually active, practice safe sex. Use a condom or other form of protection to prevent STIs (sexually transmitted infections).  Talk with your health care provider about taking a low-dose aspirin every day starting at age 50. What's next?  Go to your health care provider once a year for a well check visit.  Ask your health care provider how often you should have your eyes and teeth checked.  Stay up to date on all vaccines. This information is not intended to replace advice given to you by your health care provider. Make sure you discuss any questions you have with your health care provider. Document Revised: 08/08/2018 Document Reviewed: 08/08/2018 Elsevier Patient Education  2020 [employer]vier Inc.  

## 2020-08-02 DIAGNOSIS — E785 Hyperlipidemia, unspecified: Secondary | ICD-10-CM

## 2020-08-02 MED ORDER — ROSUVASTATIN CALCIUM 10 MG PO TABS: 10.0000 mg | ORAL_TABLET | Freq: Every evening | ORAL | 3 refills | Status: DC

## 2020-08-24 ENCOUNTER — Other Ambulatory Visit: Payer: Self-pay | Admitting: Primary Care

## 2020-08-24 DIAGNOSIS — I10 Essential (primary) hypertension: Secondary | ICD-10-CM

## 2020-08-25 NOTE — Telephone Encounter (Signed)
Pharmacy requests refill on: Losartan 100 mg & Hydrochlorothiazide 12.5 mg   LAST REFILL: 09/03/2019  LAST OV: 07/29/2020 NEXT OV: Not Scheduled  PHARMACY: [facility] [phone] [place], [place]

## 2020-10-05 ENCOUNTER — Other Ambulatory Visit: Payer: Self-pay | Admitting: Podiatry

## 2020-10-12 ENCOUNTER — Other Ambulatory Visit: Payer: Self-pay | Admitting: Primary Care

## 2020-10-12 DIAGNOSIS — E119 Type 2 diabetes mellitus without complications: Secondary | ICD-10-CM

## 2020-10-13 ENCOUNTER — Ambulatory Visit: Payer: 59 | Admitting: Podiatry

## 2020-12-29 ENCOUNTER — Ambulatory Visit: Payer: 59 | Admitting: Podiatry

## 2021-02-20 ENCOUNTER — Other Ambulatory Visit: Payer: Self-pay | Admitting: Primary Care

## 2021-02-20 DIAGNOSIS — I10 Essential (primary) hypertension: Secondary | ICD-10-CM

## 2021-03-28 ENCOUNTER — Ambulatory Visit: Payer: 59 | Admitting: Podiatry

## 2021-04-11 ENCOUNTER — Other Ambulatory Visit: Payer: Self-pay

## 2021-04-11 ENCOUNTER — Ambulatory Visit: Payer: 59 | Admitting: Podiatry

## 2021-04-11 ENCOUNTER — Encounter: Payer: Self-pay | Admitting: Podiatry

## 2021-04-11 DIAGNOSIS — E1142 Type 2 diabetes mellitus with diabetic polyneuropathy: Secondary | ICD-10-CM | POA: Diagnosis not present

## 2021-04-11 MED ORDER — GABAPENTIN 300 MG PO CAPS: 300.0000 mg | ORAL_CAPSULE | Freq: Three times a day (TID) | ORAL | 3 refills | Status: DC

## 2021-04-11 NOTE — Progress Notes (Signed)
He presents today for follow-up of his diabetic neuropathy and medications states that have run out of the gabapentin the feet starting to hurt.  And I can really start to feel it.  He has lost up to 96 pounds at this point and is looking good and feeling much better.  Objective: Vital signs are stable he is alert and oriented x3.  There is no erythema edema/drainage or odor he has nail dystrophy of the hallux left no open lesions or wounds still has some mottling to the plantar aspect of the bilateral foot but I think this is more than likely a Raynaud's type syndrome.  Assessment: Neuropathy.  Plan: I am going to refill his gabapentin '300mg'$ .  He will take 2 in the morning and 2 at bedtime.  This will be 360 for 3 months and then I will refill that for 3 refills.

## 2021-04-12 ENCOUNTER — Encounter: Payer: Self-pay | Admitting: Podiatry

## 2021-04-13 MED ORDER — GABAPENTIN 300 MG PO CAPS: ORAL_CAPSULE | ORAL | 3 refills | Status: DC

## 2021-08-18 ENCOUNTER — Other Ambulatory Visit: Payer: Self-pay | Admitting: Primary Care

## 2021-08-18 DIAGNOSIS — E785 Hyperlipidemia, unspecified: Secondary | ICD-10-CM

## 2021-08-28 ENCOUNTER — Other Ambulatory Visit: Payer: Self-pay | Admitting: Primary Care

## 2021-08-28 DIAGNOSIS — I10 Essential (primary) hypertension: Secondary | ICD-10-CM

## 2021-08-30 ENCOUNTER — Other Ambulatory Visit: Payer: Self-pay | Admitting: Primary Care

## 2021-08-30 DIAGNOSIS — I10 Essential (primary) hypertension: Secondary | ICD-10-CM

## 2021-09-26 ENCOUNTER — Ambulatory Visit (INDEPENDENT_AMBULATORY_CARE_PROVIDER_SITE_OTHER): Payer: 59 | Admitting: Primary Care

## 2021-09-26 ENCOUNTER — Encounter: Payer: Self-pay | Admitting: Primary Care

## 2021-09-26 ENCOUNTER — Other Ambulatory Visit: Payer: Self-pay

## 2021-09-26 VITALS — BP 134/82 | HR 76 | Temp 98.7°F | Ht 69.0 in | Wt 219.0 lb

## 2021-09-26 DIAGNOSIS — I10 Essential (primary) hypertension: Secondary | ICD-10-CM

## 2021-09-26 DIAGNOSIS — Z125 Encounter for screening for malignant neoplasm of prostate: Secondary | ICD-10-CM | POA: Diagnosis not present

## 2021-09-26 DIAGNOSIS — Z0001 Encounter for general adult medical examination with abnormal findings: Secondary | ICD-10-CM | POA: Diagnosis not present

## 2021-09-26 DIAGNOSIS — I2510 Atherosclerotic heart disease of native coronary artery without angina pectoris: Secondary | ICD-10-CM | POA: Diagnosis not present

## 2021-09-26 DIAGNOSIS — R69 Illness, unspecified: Secondary | ICD-10-CM | POA: Diagnosis not present

## 2021-09-26 DIAGNOSIS — E782 Mixed hyperlipidemia: Secondary | ICD-10-CM

## 2021-09-26 DIAGNOSIS — R7303 Prediabetes: Secondary | ICD-10-CM

## 2021-09-26 DIAGNOSIS — Z1211 Encounter for screening for malignant neoplasm of colon: Secondary | ICD-10-CM

## 2021-09-26 DIAGNOSIS — G2581 Restless legs syndrome: Secondary | ICD-10-CM

## 2021-09-26 DIAGNOSIS — N529 Male erectile dysfunction, unspecified: Secondary | ICD-10-CM | POA: Insufficient documentation

## 2021-09-26 DIAGNOSIS — F1020 Alcohol dependence, uncomplicated: Secondary | ICD-10-CM

## 2021-09-26 LAB — COMPREHENSIVE METABOLIC PANEL
ALT: 26 U/L (ref 0–53)
AST: 25 U/L (ref 0–37)
Albumin: 4.3 g/dL (ref 3.5–5.2)
Alkaline Phosphatase: 50 U/L (ref 39–117)
BUN: 14 mg/dL (ref 6–23)
CO2: 31 mEq/L (ref 19–32)
Calcium: 9.4 mg/dL (ref 8.4–10.5)
Chloride: 99 mEq/L (ref 96–112)
Creatinine, Ser: 0.76 mg/dL (ref 0.40–1.50)
GFR: 97.04 mL/min (ref >60.00)
Glucose, Bld: 94 mg/dL (ref 70–99)
Potassium: 4.1 mEq/L (ref 3.5–5.1)
Sodium: 137 mEq/L (ref 135–145)
Total Bilirubin: 0.6 mg/dL (ref 0.2–1.2)
Total Protein: 6.9 g/dL (ref 6.0–8.3)

## 2021-09-26 LAB — LIPID PANEL
Cholesterol: 151 mg/dL (ref 0–200)
HDL: 73.2 mg/dL (ref >39.00)
LDL Cholesterol: 67 mg/dL (ref 0–99)
NonHDL: 77.8
Total CHOL/HDL Ratio: 2
Triglycerides: 53 mg/dL (ref 0.0–149.0)
VLDL: 10.6 mg/dL (ref 0.0–40.0)

## 2021-09-26 LAB — HEMOGLOBIN A1C: Hgb A1c MFr Bld: 5.3 % (ref 4.6–6.5)

## 2021-09-26 LAB — PSA: PSA: 0.37 ng/mL (ref 0.10–4.00)

## 2021-09-26 MED ORDER — TADALAFIL 10 MG PO TABS: ORAL_TABLET | ORAL | 0 refills | Status: DC

## 2021-09-26 NOTE — Assessment & Plan Note (Signed)
No concerns today.  Continue to monitor. He is managed on gabapentin 600 mg BID per podiatry.

## 2021-09-26 NOTE — Assessment & Plan Note (Signed)
Active with 12-15 twelve ounce beers nightly. Encouraged cessation.  Repeat liver enzymes pending.

## 2021-09-26 NOTE — Patient Instructions (Addendum)
Complete the cologuard kit once received.  Stop by the lab prior to leaving today. I will notify you of your results once received.   You can try tadalafil (Cialis) 10 mg for erectile dysfunction. Take 1 tablet by mouth 30 min prior to sexual activity.  It was a pleasure to see you today!  Preventive Care 34-62 Years Old, Male Preventive care refers to lifestyle choices and visits with your health care provider that can promote health and wellness. Preventive care visits are also called wellness exams. What can I expect for my preventive care visit? Counseling During your preventive care visit, your health care provider may ask about your: Medical history, including: Past medical problems. Family medical history. Current health, including: Emotional well-being. Home life and relationship well-being. Sexual activity. Lifestyle, including: Alcohol, nicotine or tobacco, and drug use. Access to firearms. Diet, exercise, and sleep habits. Safety issues such as seatbelt and bike helmet use. Sunscreen use. Work and work Statistician. Physical exam Your health care provider will check your: Height and weight. These may be used to calculate your BMI (body mass index). BMI is a measurement that tells if you are at a healthy weight. Waist circumference. This measures the distance around your waistline. This measurement also tells if you are at a healthy weight and may help predict your risk of certain diseases, such as type 2 diabetes and high blood pressure. Heart rate and blood pressure. Body temperature. Skin for abnormal spots. What immunizations do I need? Vaccines are usually given at various ages, according to a schedule. Your health care provider will recommend vaccines for you based on your age, medical history, and lifestyle or other factors, such as travel or where you work. What tests do I need? Screening Your health care provider may recommend screening tests for certain  conditions. This may include: Lipid and cholesterol levels. Diabetes screening. This is done by checking your blood sugar (glucose) after you have not eaten for a while (fasting). Hepatitis B test. Hepatitis C test. HIV (human immunodeficiency virus) test. STI (sexually transmitted infection) testing, if you are at risk. Lung cancer screening. Prostate cancer screening. Colorectal cancer screening. Talk with your health care provider about your test results, treatment options, and if necessary, the need for more tests. Follow these instructions at home: Eating and drinking  Eat a diet that includes fresh fruits and vegetables, whole grains, lean protein, and low-fat dairy products. Take vitamin and mineral supplements as recommended by your health care provider. Do not drink alcohol if your health care provider tells you not to drink. If you drink alcohol: Limit how much you have to 0-2 drinks a day. Know how much alcohol is in your drink. In the [place] one drink equals one 12 oz bottle of beer (355 mL), one 5 oz glass of wine (148 mL), or one 1 oz glass of hard liquor (44 mL). Lifestyle Brush your teeth every morning and night with fluoride toothpaste. Floss one time each day. Exercise for at least 30 minutes 5 or more days each week. Do not use any products that contain nicotine or tobacco. These products include cigarettes, chewing tobacco, and vaping devices, such as e-cigarettes. If you need help quitting, ask your health care provider. Do not use drugs. If you are sexually active, practice safe sex. Use a condom or other form of protection to prevent STIs. Take aspirin only as told by your health care provider. Make sure that you understand how much to take and what form  to take. Work with your health care provider to find out whether it is safe and beneficial for you to take aspirin daily. Find healthy ways to manage stress, such as: Meditation, yoga, or listening to  music. Journaling. Talking to a trusted person. Spending time with friends and family. Minimize exposure to UV radiation to reduce your risk of skin cancer. Safety Always wear your seat belt while driving or riding in a vehicle. Do not drive: If you have been drinking alcohol. Do not ride with someone who has been drinking. When you are tired or distracted. While texting. If you have been using any mind-altering substances or drugs. Wear a helmet and other protective equipment during sports activities. If you have firearms in your house, make sure you follow all gun safety procedures. What's next? Go to your health care provider once a year for an annual wellness visit. Ask your health care provider how often you should have your eyes and teeth checked. Stay up to date on all vaccines. This information is not intended to replace advice given to you by your health care provider. Make sure you discuss any questions you have with your health care provider. Document Revised: 02/09/2021 Document Reviewed: 02/09/2021 Elsevier Patient Education  [place].
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
## 2021-09-26 NOTE — Assessment & Plan Note (Signed)
Controlled.  Continue losartan 100 mg daily, HCTZ 12.5 mg daily.  CMP pending

## 2021-09-26 NOTE — Assessment & Plan Note (Signed)
Commended him on weight loss!  Consider discontinuation of metformin 500 mg BID. Repeat A1C pending.

## 2021-09-26 NOTE — Assessment & Plan Note (Signed)
Asymptomatic.  Repeat lipid panel pending. Continue aspirin 325 mg daily.

## 2021-09-26 NOTE — Assessment & Plan Note (Signed)
Chronic.  Failed Viagra previously.   Trial of Cialis 5 mg sent to pharmacy to use PRN.  He will update.  Consider Urology referral if no improvement.

## 2021-09-26 NOTE — Assessment & Plan Note (Signed)
Commended him on weight loss!  LDL goal of <70 given CAD history. Repeat lipid panel pending. Continue rosuvastatin 10 mg.

## 2021-09-26 NOTE — Assessment & Plan Note (Signed)
Declines influenza and shingles vaccines. PSA due and pending. Colon cancer screening overdue, he again declines colonoscopy but opts for Cologuard. Will resend Cologuard.  Commended him on weight loss. Strongly advised he reduce alcohol consumption.  Exam today stable. Labs pending

## 2021-09-28 ENCOUNTER — Other Ambulatory Visit: Payer: Self-pay | Admitting: Primary Care

## 2021-09-28 DIAGNOSIS — I10 Essential (primary) hypertension: Secondary | ICD-10-CM

## 2021-09-28 DIAGNOSIS — E785 Hyperlipidemia, unspecified: Secondary | ICD-10-CM

## 2021-09-29 ENCOUNTER — Encounter: Payer: 59 | Admitting: Primary Care

## 2022-04-24 ENCOUNTER — Other Ambulatory Visit: Payer: Self-pay | Admitting: *Deleted

## 2022-04-24 MED ORDER — GABAPENTIN 300 MG PO CAPS: ORAL_CAPSULE | ORAL | 3 refills | Status: DC

## 2022-08-24 ENCOUNTER — Encounter: Payer: Self-pay | Admitting: Primary Care

## 2022-08-24 ENCOUNTER — Ambulatory Visit: Payer: Self-pay | Admitting: Primary Care

## 2022-08-24 VITALS — BP 142/86 | HR 91 | Temp 98.1°F | Ht 69.0 in | Wt 221.0 lb

## 2022-08-24 DIAGNOSIS — Z0001 Encounter for general adult medical examination with abnormal findings: Secondary | ICD-10-CM

## 2022-08-24 DIAGNOSIS — S81802S Unspecified open wound, left lower leg, sequela: Secondary | ICD-10-CM | POA: Insufficient documentation

## 2022-08-24 DIAGNOSIS — N529 Male erectile dysfunction, unspecified: Secondary | ICD-10-CM

## 2022-08-24 DIAGNOSIS — R7303 Prediabetes: Secondary | ICD-10-CM

## 2022-08-24 DIAGNOSIS — I2510 Atherosclerotic heart disease of native coronary artery without angina pectoris: Secondary | ICD-10-CM

## 2022-08-24 DIAGNOSIS — I10 Essential (primary) hypertension: Secondary | ICD-10-CM

## 2022-08-24 DIAGNOSIS — Z1211 Encounter for screening for malignant neoplasm of colon: Secondary | ICD-10-CM

## 2022-08-24 DIAGNOSIS — Z125 Encounter for screening for malignant neoplasm of prostate: Secondary | ICD-10-CM

## 2022-08-24 DIAGNOSIS — G2581 Restless legs syndrome: Secondary | ICD-10-CM

## 2022-08-24 DIAGNOSIS — E782 Mixed hyperlipidemia: Secondary | ICD-10-CM

## 2022-08-24 HISTORY — DX: Unspecified open wound, left lower leg, sequela: S81.802S

## 2022-08-24 LAB — COMPREHENSIVE METABOLIC PANEL
ALT: 29 U/L (ref 0–53)
AST: 25 U/L (ref 0–37)
Albumin: 4.5 g/dL (ref 3.5–5.2)
Alkaline Phosphatase: 54 U/L (ref 39–117)
BUN: 16 mg/dL (ref 6–23)
CO2: 30 mEq/L (ref 19–32)
Calcium: 9.6 mg/dL (ref 8.4–10.5)
Chloride: 97 mEq/L (ref 96–112)
Creatinine, Ser: 0.79 mg/dL (ref 0.40–1.50)
GFR: 95.3 mL/min (ref >60.00)
Glucose, Bld: 98 mg/dL (ref 70–99)
Potassium: 4.4 mEq/L (ref 3.5–5.1)
Sodium: 136 mEq/L (ref 135–145)
Total Bilirubin: 0.6 mg/dL (ref 0.2–1.2)
Total Protein: 7.3 g/dL (ref 6.0–8.3)

## 2022-08-24 LAB — LIPID PANEL
Cholesterol: 245 mg/dL — ABNORMAL HIGH (ref 0–200)
HDL: 72.3 mg/dL (ref >39.00)
LDL Cholesterol: 146 mg/dL — ABNORMAL HIGH (ref 0–99)
NonHDL: 172.74
Total CHOL/HDL Ratio: 3
Triglycerides: 133 mg/dL (ref 0.0–149.0)
VLDL: 26.6 mg/dL (ref 0.0–40.0)

## 2022-08-24 LAB — HEMOGLOBIN A1C: Hgb A1c MFr Bld: 5.5 % (ref 4.6–6.5)

## 2022-08-24 MED ORDER — MUPIROCIN 2 % EX OINT: 1.0000 | TOPICAL_OINTMENT | Freq: Two times a day (BID) | CUTANEOUS | 0 refills | Status: AC

## 2022-08-24 MED ORDER — TADALAFIL 10 MG PO TABS: ORAL_TABLET | ORAL | 0 refills | Status: DC

## 2022-08-24 NOTE — Assessment & Plan Note (Signed)
Repeat lipid panel pending. Continue rosuvastatin 10 mg.

## 2022-08-24 NOTE — Assessment & Plan Note (Signed)
Repeat A1C pending.  Discussed the importance of a healthy diet and regular exercise in order for weight loss, and to reduce the risk of further co-morbidity.  
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
## 2022-08-24 NOTE — Assessment & Plan Note (Signed)
Controlled.  Continue tadalafil 10 mg PRN.

## 2022-08-24 NOTE — Assessment & Plan Note (Addendum)
Shingrix and influenza vaccines due, provided today. PSA due and pending. He agrees to complete Cologuard this year. Orders placed. He declines colonoscopy.  Discussed the importance of a healthy diet and regular exercise in order for weight loss, and to reduce the risk of further co-morbidity.  Exam as noted Labs pending.  Follow up in 1 year for repeat physical.

## 2022-08-24 NOTE — Assessment & Plan Note (Signed)
Asymptomatic.  Continue BP control, diabetes control, lipid control.

## 2022-08-24 NOTE — Assessment & Plan Note (Signed)
Doesn't appear infectious. It does appear that his socks are preventing appropriate healing. Discussed this today.  Recommended bandages during the day with daily changes. Also recommended wound center evaluation for which he declines.  Rx for mupirocin ointment provided.  Recommended he update if no improvement. Wound center referral will be placed if no improvement.

## 2022-08-24 NOTE — Assessment & Plan Note (Signed)
Above goal today, improved and at goal outside of the office. Continue losartan 100 mg daily, HCTZ 12.5 mg daily. CMP pending.

## 2022-08-24 NOTE — Patient Instructions (Signed)
Stop by the lab prior to leaving today. I will notify you of your results once received.   Apply the ointment twice daily for 1-2 weeks. Start applying dressings to the wound.  Please notify me if no improvement.   Complete the Cologuard Kit for colon cancer screening.  It was a pleasure to see you today!  Preventive Care 64-62 Years Old, Male Preventive care refers to lifestyle choices and visits with your health care provider that can promote health and wellness. Preventive care visits are also called wellness exams. What can I expect for my preventive care visit? Counseling During your preventive care visit, your health care provider may ask about your: Medical history, including: Past medical problems. Family medical history. Current health, including: Emotional well-being. Home life and relationship well-being. Sexual activity. Lifestyle, including: Alcohol, nicotine or tobacco, and drug use. Access to firearms. Diet, exercise, and sleep habits. Safety issues such as seatbelt and bike helmet use. Sunscreen use. Work and work Statistician. Physical exam Your health care provider will check your: Height and weight. These may be used to calculate your BMI (body mass index). BMI is a measurement that tells if you are at a healthy weight. Waist circumference. This measures the distance around your waistline. This measurement also tells if you are at a healthy weight and may help predict your risk of certain diseases, such as type 2 diabetes and high blood pressure. Heart rate and blood pressure. Body temperature. Skin for abnormal spots. What immunizations do I need?  Vaccines are usually given at various ages, according to a schedule. Your health care provider will recommend vaccines for you based on your age, medical history, and lifestyle or other factors, such as travel or where you work. What tests do I need? Screening Your health care provider may recommend screening  tests for certain conditions. This may include: Lipid and cholesterol levels. Diabetes screening. This is done by checking your blood sugar (glucose) after you have not eaten for a while (fasting). Hepatitis B test. Hepatitis C test. HIV (human immunodeficiency virus) test. STI (sexually transmitted infection) testing, if you are at risk. Lung cancer screening. Prostate cancer screening. Colorectal cancer screening. Talk with your health care provider about your test results, treatment options, and if necessary, the need for more tests. Follow these instructions at home: Eating and drinking  Eat a diet that includes fresh fruits and vegetables, whole grains, lean protein, and low-fat dairy products. Take vitamin and mineral supplements as recommended by your health care provider. Do not drink alcohol if your health care provider tells you not to drink. If you drink alcohol: Limit how much you have to 0-2 drinks a day. Know how much alcohol is in your drink. In the [place] one drink equals one 12 oz bottle of beer (355 mL), one 5 oz glass of wine (148 mL), or one 1 oz glass of hard liquor (44 mL). Lifestyle Brush your teeth every morning and night with fluoride toothpaste. Floss one time each day. Exercise for at least 30 minutes 5 or more days each week. Do not use any products that contain nicotine or tobacco. These products include cigarettes, chewing tobacco, and vaping devices, such as e-cigarettes. If you need help quitting, ask your health care provider. Do not use drugs. If you are sexually active, practice safe sex. Use a condom or other form of protection to prevent STIs. Take aspirin only as told by your health care provider. Make sure that you understand how much to  take and what form to take. Work with your health care provider to find out whether it is safe and beneficial for you to take aspirin daily. Find healthy ways to manage stress, such as: Meditation, yoga, or listening  to music. Journaling. Talking to a trusted person. Spending time with friends and family. Minimize exposure to UV radiation to reduce your risk of skin cancer. Safety Always wear your seat belt while driving or riding in a vehicle. Do not drive: If you have been drinking alcohol. Do not ride with someone who has been drinking. When you are tired or distracted. While texting. If you have been using any mind-altering substances or drugs. Wear a helmet and other protective equipment during sports activities. If you have firearms in your house, make sure you follow all gun safety procedures. What's next? Go to your health care provider once a year for an annual wellness visit. Ask your health care provider how often you should have your eyes and teeth checked. Stay up to date on all vaccines. This information is not intended to replace advice given to you by your health care provider. Make sure you discuss any questions you have with your health care provider. Document Revised: 02/09/2021 Document Reviewed: 02/09/2021 Elsevier Patient Education  [place].

## 2022-08-24 NOTE — Assessment & Plan Note (Signed)
Controlled. Continue gabapentin 1 capsule in the AM and 2 capsules in the PM.

## 2022-08-25 ENCOUNTER — Telehealth: Payer: Self-pay | Admitting: Primary Care

## 2022-08-25 ENCOUNTER — Other Ambulatory Visit: Payer: Self-pay | Admitting: Primary Care

## 2022-08-25 DIAGNOSIS — E782 Mixed hyperlipidemia: Secondary | ICD-10-CM

## 2022-08-25 LAB — PSA: PSA: 0.41 ng/mL (ref 0.10–4.00)

## 2022-08-25 NOTE — Telephone Encounter (Signed)
See result note for further information

## 2022-08-25 NOTE — Telephone Encounter (Signed)
Patient returned call regarding labs,he would like a call back.

## 2022-09-13 ENCOUNTER — Other Ambulatory Visit: Payer: Self-pay | Admitting: Primary Care

## 2022-09-13 DIAGNOSIS — I10 Essential (primary) hypertension: Secondary | ICD-10-CM

## 2022-10-12 ENCOUNTER — Other Ambulatory Visit: Payer: Self-pay | Admitting: Primary Care

## 2022-10-27 ENCOUNTER — Other Ambulatory Visit: Payer: Self-pay

## 2022-11-05 ENCOUNTER — Other Ambulatory Visit: Payer: Self-pay | Admitting: Primary Care

## 2022-11-05 DIAGNOSIS — I10 Essential (primary) hypertension: Secondary | ICD-10-CM

## 2023-05-21 ENCOUNTER — Other Ambulatory Visit: Payer: Self-pay | Admitting: Podiatry

## 2023-08-27 ENCOUNTER — Encounter: Payer: Self-pay | Admitting: Primary Care

## 2023-08-27 ENCOUNTER — Ambulatory Visit (INDEPENDENT_AMBULATORY_CARE_PROVIDER_SITE_OTHER): Payer: Self-pay | Admitting: Primary Care

## 2023-08-27 VITALS — BP 138/82 | HR 91 | Temp 98.2°F | Ht 69.0 in | Wt 221.0 lb

## 2023-08-27 DIAGNOSIS — E785 Hyperlipidemia, unspecified: Secondary | ICD-10-CM

## 2023-08-27 DIAGNOSIS — I10 Essential (primary) hypertension: Secondary | ICD-10-CM

## 2023-08-27 DIAGNOSIS — Z7984 Long term (current) use of oral hypoglycemic drugs: Secondary | ICD-10-CM

## 2023-08-27 DIAGNOSIS — Z125 Encounter for screening for malignant neoplasm of prostate: Secondary | ICD-10-CM

## 2023-08-27 DIAGNOSIS — E1165 Type 2 diabetes mellitus with hyperglycemia: Secondary | ICD-10-CM

## 2023-08-27 DIAGNOSIS — N529 Male erectile dysfunction, unspecified: Secondary | ICD-10-CM

## 2023-08-27 DIAGNOSIS — Z0001 Encounter for general adult medical examination with abnormal findings: Secondary | ICD-10-CM

## 2023-08-27 DIAGNOSIS — Z1211 Encounter for screening for malignant neoplasm of colon: Secondary | ICD-10-CM

## 2023-08-27 DIAGNOSIS — E782 Mixed hyperlipidemia: Secondary | ICD-10-CM

## 2023-08-27 DIAGNOSIS — F1020 Alcohol dependence, uncomplicated: Secondary | ICD-10-CM

## 2023-08-27 DIAGNOSIS — R3912 Poor urinary stream: Secondary | ICD-10-CM | POA: Insufficient documentation

## 2023-08-27 LAB — COMPREHENSIVE METABOLIC PANEL
ALT: 29 U/L (ref 0–53)
AST: 25 U/L (ref 0–37)
Albumin: 4.3 g/dL (ref 3.5–5.2)
Alkaline Phosphatase: 53 U/L (ref 39–117)
BUN: 15 mg/dL (ref 6–23)
CO2: 28 meq/L (ref 19–32)
Calcium: 9.3 mg/dL (ref 8.4–10.5)
Chloride: 101 meq/L (ref 96–112)
Creatinine, Ser: 0.74 mg/dL (ref 0.40–1.50)
GFR: 96.52 mL/min (ref >60.00)
Glucose, Bld: 99 mg/dL (ref 70–99)
Potassium: 4.5 meq/L (ref 3.5–5.1)
Sodium: 139 meq/L (ref 135–145)
Total Bilirubin: 0.4 mg/dL (ref 0.2–1.2)
Total Protein: 6.8 g/dL (ref 6.0–8.3)

## 2023-08-27 LAB — LIPID PANEL
Cholesterol: 231 mg/dL — ABNORMAL HIGH (ref 0–200)
HDL: 66.1 mg/dL (ref >39.00)
LDL Cholesterol: 146 mg/dL — ABNORMAL HIGH (ref 0–99)
NonHDL: 164.95
Total CHOL/HDL Ratio: 3
Triglycerides: 94 mg/dL (ref 0.0–149.0)
VLDL: 18.8 mg/dL (ref 0.0–40.0)

## 2023-08-27 LAB — HEMOGLOBIN A1C: Hgb A1c MFr Bld: 5.7 % (ref 4.6–6.5)

## 2023-08-27 LAB — PSA: PSA: 0.49 ng/mL (ref 0.10–4.00)

## 2023-08-27 LAB — MICROALBUMIN / CREATININE URINE RATIO
Creatinine,U: 59.7 mg/dL
Microalb Creat Ratio: 1.2 mg/g (ref 0.0–30.0)
Microalb, Ur: <0.7 mg/dL (ref 0.0–1.9)

## 2023-08-27 MED ORDER — ROSUVASTATIN CALCIUM 10 MG PO TABS: 10.0000 mg | ORAL_TABLET | Freq: Every day | ORAL | 3 refills | Status: DC

## 2023-08-27 MED ORDER — TADALAFIL 5 MG PO TABS: 2.5000 mg | ORAL_TABLET | Freq: Every day | ORAL | 0 refills | Status: DC

## 2023-08-27 NOTE — Assessment & Plan Note (Addendum)
Uncontrolled.   Given what sounds like BPH symptoms, coupled with erectile dysfunction, we will start tadalafil 2.5 mg daily.  Can titrate up to 5 mg daily.  He will update.

## 2023-08-27 NOTE — Patient Instructions (Signed)
Stop by the lab prior to leaving today. I will notify you of your results once received.   Start tadalafil (Cialis) 5 mg tablets for erections and urine flow.  Take 1/2 tablet by mouth every day.  Please keep me updated regarding your symptoms.  Complete the Cologuard kit once received.  It was a pleasure to see you today!

## 2023-08-27 NOTE — Assessment & Plan Note (Signed)
Continued, 12-15 beers daily. Encourage cessation.  Liver enzymes pending.

## 2023-08-27 NOTE — Assessment & Plan Note (Signed)
Borderline high today.  Encouraged reduction of alcohol use.  Also discouraged chronic use of phentermine.  Continue hydrochlorothiazide 12.5 mg daily, losartan 100 mg daily.

## 2023-08-27 NOTE — Assessment & Plan Note (Signed)
Declines influenza and Shingrix vaccines.   Colonoscopy overdue.  He declines colonoscopy but opts for Cologuard. Orders placed. PSA due and pending.  Discussed the importance of a healthy diet and regular exercise in order for weight loss, and to reduce the risk of further co-morbidity.  Exam stable. Labs pending.  Follow up in 1 year for repeat physical.

## 2023-08-27 NOTE — Assessment & Plan Note (Signed)
LDL above goal last year.  Not taking rosuvastatin.  Repeat lipid panel pending.

## 2023-08-27 NOTE — Assessment & Plan Note (Addendum)
Repeat A1C pending. Continue metformin 500 mg twice daily. Continue gabapentin 600 mg twice daily for neuropathy.  Discussed the importance of a healthy diet and regular exercise in order for weight loss, and to reduce the risk of further co-morbidity.
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
## 2023-08-27 NOTE — Assessment & Plan Note (Signed)
Symptoms representative of BPH.  Given symptoms, coupled with chronic erectile dysfunction, will start Cialis 2.5 mg daily.  Consider titration upward to 5 mg daily if needed.  He will update.  Also consider Flomax.

## 2023-09-16 ENCOUNTER — Other Ambulatory Visit: Payer: Self-pay | Admitting: Primary Care

## 2023-09-16 DIAGNOSIS — I10 Essential (primary) hypertension: Secondary | ICD-10-CM

## 2023-10-15 DIAGNOSIS — R3912 Poor urinary stream: Secondary | ICD-10-CM

## 2023-10-17 MED ORDER — TAMSULOSIN HCL 0.4 MG PO CAPS: 0.4000 mg | ORAL_CAPSULE | Freq: Every day | ORAL | 0 refills | Status: DC

## 2023-11-28 ENCOUNTER — Encounter: Payer: Self-pay | Admitting: Podiatry

## 2023-11-28 ENCOUNTER — Ambulatory Visit (INDEPENDENT_AMBULATORY_CARE_PROVIDER_SITE_OTHER): Payer: Self-pay | Admitting: Podiatry

## 2023-11-28 DIAGNOSIS — M778 Other enthesopathies, not elsewhere classified: Secondary | ICD-10-CM

## 2023-11-28 DIAGNOSIS — G5793 Unspecified mononeuropathy of bilateral lower limbs: Secondary | ICD-10-CM

## 2023-11-28 MED ORDER — GABAPENTIN 600 MG PO TABS: 600.0000 mg | ORAL_TABLET | Freq: Two times a day (BID) | ORAL | 3 refills | Status: AC

## 2023-11-28 NOTE — Progress Notes (Signed)
 He presents today for follow-up of his neuropathy bilaterally.  He states that he needs a refill on his gabapentin he ran out and was having some severe issues so he started taking his sisters medication.  He states that he has been doing really well as long as he takes the medicine he is perfectly fine.  Objective: Vital signs are stable he is alert oriented x 3 pulses are palpable he has some cyanosis to the toes microvascular disease most likely autonomic type neuropathy as well.  Assessment: Neuropathy with microvascular disease.  Plan: Continue his gabapentin 600 mg in the morning 600 mg at bedtime.  We will dispense 67-month supply with 3 refills.  If this patient calls requesting gabapentin we will give him a partial fill until he can have an appointment with [place].

## 2024-01-07 ENCOUNTER — Other Ambulatory Visit: Payer: Self-pay | Admitting: Primary Care

## 2024-01-07 DIAGNOSIS — R3912 Poor urinary stream: Secondary | ICD-10-CM

## 2024-01-07 MED ORDER — TAMSULOSIN HCL 0.4 MG PO CAPS
0.4000 mg | ORAL_CAPSULE | Freq: Every day | ORAL | 1 refills | Status: DC
Start: 2024-01-07 — End: 2024-06-29

## 2024-05-18 ENCOUNTER — Other Ambulatory Visit: Payer: Self-pay | Admitting: Primary Care

## 2024-05-18 DIAGNOSIS — I10 Essential (primary) hypertension: Secondary | ICD-10-CM

## 2024-06-29 ENCOUNTER — Other Ambulatory Visit: Payer: Self-pay | Admitting: Primary Care

## 2024-06-29 DIAGNOSIS — R3912 Poor urinary stream: Secondary | ICD-10-CM

## 2024-07-21 ENCOUNTER — Ambulatory Visit: Admitting: Family

## 2024-07-21 ENCOUNTER — Ambulatory Visit (INDEPENDENT_AMBULATORY_CARE_PROVIDER_SITE_OTHER)
Admission: RE | Admit: 2024-07-21 | Discharge: 2024-07-21 | Disposition: A | Discharge status: Home / Self Care | Source: Ambulatory Visit | Attending: Family | Admitting: Family

## 2024-07-21 ENCOUNTER — Ambulatory Visit: Payer: Self-pay

## 2024-07-21 ENCOUNTER — Encounter: Payer: Self-pay | Admitting: Family

## 2024-07-21 VITALS — BP 130/86 | HR 85 | Temp 98.3°F | Ht 69.0 in | Wt 219.6 lb

## 2024-07-21 DIAGNOSIS — M79605 Pain in left leg: Secondary | ICD-10-CM | POA: Diagnosis not present

## 2024-07-21 DIAGNOSIS — S8992XA Unspecified injury of left lower leg, initial encounter: Secondary | ICD-10-CM | POA: Diagnosis not present

## 2024-07-21 DIAGNOSIS — L03116 Cellulitis of left lower limb: Secondary | ICD-10-CM | POA: Diagnosis not present

## 2024-07-21 DIAGNOSIS — R600 Localized edema: Secondary | ICD-10-CM

## 2024-07-21 HISTORY — DX: Cellulitis of left lower limb: L03.116

## 2024-07-21 LAB — CBC WITH DIFFERENTIAL/PLATELET
Basophils Absolute: 0 K/uL (ref 0.0–0.1)
Basophils Relative: 0.5 % (ref 0.0–3.0)
Eosinophils Absolute: 0.1 K/uL (ref 0.0–0.7)
Eosinophils Relative: 1.6 % (ref 0.0–5.0)
HCT: 38.8 % — ABNORMAL LOW (ref 39.0–52.0)
Hemoglobin: 13.6 g/dL (ref 13.0–17.0)
Lymphocytes Relative: 18.2 % (ref 12.0–46.0)
Lymphs Abs: 0.8 K/uL (ref 0.7–4.0)
MCHC: 35 g/dL (ref 30.0–36.0)
MCV: 99.4 fl (ref 78.0–100.0)
Monocytes Absolute: 0.5 K/uL (ref 0.1–1.0)
Monocytes Relative: 10.8 % (ref 3.0–12.0)
Neutro Abs: 3.1 K/uL (ref 1.4–7.7)
Neutrophils Relative %: 68.9 % (ref 43.0–77.0)
Platelets: 242 K/uL (ref 150.0–400.0)
RBC: 3.9 Mil/uL — ABNORMAL LOW (ref 4.22–5.81)
RDW: 12.6 % (ref 11.5–15.5)
WBC: 4.5 K/uL (ref 4.0–10.5)

## 2024-07-21 LAB — BASIC METABOLIC PANEL WITH GFR
BUN: 11 mg/dL (ref 6–23)
CO2: 31 meq/L (ref 19–32)
Calcium: 9.1 mg/dL (ref 8.4–10.5)
Chloride: 98 meq/L (ref 96–112)
Creatinine, Ser: 0.62 mg/dL (ref 0.40–1.50)
GFR: 101.17 mL/min
Glucose, Bld: 94 mg/dL (ref 70–99)
Potassium: 4.1 meq/L (ref 3.5–5.1)
Sodium: 136 meq/L (ref 135–145)

## 2024-07-21 LAB — SEDIMENTATION RATE: Sed Rate: 40 mm/h — ABNORMAL HIGH (ref 0–20)

## 2024-07-21 MED ORDER — DOXYCYCLINE HYCLATE 100 MG PO TABS: 100.0000 mg | ORAL_TABLET | Freq: Two times a day (BID) | ORAL | 0 refills | Status: AC

## 2024-07-21 NOTE — Progress Notes (Signed)
 Established Patient Office Visit  Subjective:      CC:  Chief Complaint  Patient presents with   Leg Injury    Hit his L knee on a trailer hitch last week.    HPI: Samuel Heath is a 63 y.o. male presenting on 07/21/2024 for Leg Injury (Hit his L knee on a trailer hitch last week.) .  Discussed the use of AI scribe software for clinical note transcription with the patient, who gave verbal consent to proceed.  History of Present Illness Samuel Heath is a 63 year old male who presents with a swollen and painful left leg after trauma.  Approximately two weeks ago, he injured his left knee while working under a mobile home, placing it on a rock which caused initial bruising. Last Wednesday, he accidentally ran his leg into the side of a trailer, exacerbating the injury. Since then, he has experienced significant swelling and redness in the left leg, describing it as 'warm, swollen, and ugly'. The swelling is new and atypical for him.  He has been taking amoxicillin  500 mg for the past three days, provided by his sister, but does not feel it is helping. He has not taken any over-the-counter pain medications like Tylenol  or ibuprofen. The leg is sometimes painful and itchy, and he scratches it. Swelling is more pronounced in the morning after bathing, and he finds it difficult to walk on the leg initially, though it improves as the day progresses.  His past medical history includes coronary artery disease with a mildly elevated coronary calcium  score. He has undergone a cardiac catheterization per notes with cardiology in the past, which showed non-obstructive coronary artery disease. He is on 325 mg of aspirin  daily, started by his podiatrist he believes, does appear in notes was on 81 mg prior. He also takes phentermine for weight loss, prescribed by a weight loss center, and has lost almost 100 pounds. He denies smoking.  No pain when pulling back his foot but notes muscle pain when  his foot is elevated. No tenderness in the back of the leg but notes itching.         Social history:  Relevant past medical, surgical, family and social history reviewed and updated as indicated. Interim medical history since our last visit reviewed.  Allergies and medications reviewed and updated.  DATA REVIEWED: CHART IN EPIC     ROS: Negative unless specifically indicated above in HPI.    Current Outpatient Medications:    aspirin  325 MG tablet, Take 325 mg by mouth. , Disp: , Rfl:    Cyanocobalamin (VITAMIN B 12 PO), Take 1 tablet by mouth daily. Reported on 12/09/2015, Disp: , Rfl:    doxycycline  (VIBRA -TABS) 100 MG tablet, Take 1 tablet (100 mg total) by mouth 2 (two) times daily for 10 days., Disp: 20 tablet, Rfl: 0   gabapentin  (NEURONTIN ) 600 MG tablet, Take 1 tablet (600 mg total) by mouth 2 (two) times daily., Disp: 180 tablet, Rfl: 3   hydrochlorothiazide  (HYDRODIURIL ) 12.5 MG tablet, Take 1 tablet by mouth once daily for blood pressure, Disp: 90 tablet, Rfl: 0   losartan  (COZAAR ) 100 MG tablet, Take 1 tablet by mouth once daily for blood pressure, Disp: 90 tablet, Rfl: 0   metFORMIN  (GLUCOPHAGE ) 500 MG tablet, TAKE 1 TABLET BY MOUTH TWICE DAILY WITH A MEAL FOR DIABETES, Disp: 180 tablet, Rfl: 3   Multiple Vitamins-Minerals (PX COMPLETE SENIOR MULTIVITS) TABS, Take by mouth., Disp: , Rfl:  mupirocin  ointment (BACTROBAN ) 2 %, Apply 1 Application topically 2 (two) times daily., Disp: 30 g, Rfl: 0   omeprazole (PRILOSEC) 20 MG capsule, Take 20 mg by mouth daily., Disp: , Rfl:    phentermine 37.5 MG capsule, Take 37.5 mg by mouth every morning., Disp: , Rfl:    tamsulosin  (FLOMAX ) 0.4 MG CAPS capsule, TAKE 1 CAPSULE BY MOUTH ONCE DAILY FOR URINE FLOW, Disp: 90 capsule, Rfl: 0  Current Facility-Administered Medications:    betamethasone  acetate-betamethasone  sodium phosphate  (CELESTONE ) injection 12 mg, 12 mg, Intramuscular, Once, Evans, Brent M, DPM         Objective:   `       BP 130/86 (BP Location: Right Arm, Patient Position: Sitting)   Pulse 85   Temp 98.3 F (36.8 C) (Temporal)   Ht 5' 9 (1.753 m)   Wt 219 lb 9.6 oz (99.6 kg)   SpO2 96%   BMI 32.43 kg/m   Physical Exam MUSCULOSKELETAL: Knee pain on movement. Foot pain on dorsiflexion. Leg tender on palpation. SKIN: Redness on leg spreading. Leg warm, swollen, and erythematous.  Wt Readings from Last 3 Encounters:  07/21/24 219 lb 9.6 oz (99.6 kg)  08/27/23 221 lb (100.2 kg)  08/24/22 221 lb (100.2 kg)    Physical Exam Musculoskeletal:     Left knee: Swelling, deformity and erythema present. Normal range of motion. Tenderness present.            Results   Assessment & Plan:   Assessment and Plan Assessment & Plan Cellulitis of left lower limb with pain and swelling Cellulitis of the left lower limb with significant redness, swelling, and warmth. The condition is acute and worsening, with concern for potential progression to osteomyelitis due to the severity of symptoms. Current treatment with amoxicillin  is ineffective. - Ordered knee x-ray to assess for underlying bone infection - Ordered blood work including D-dimer, sed rate, and white blood cell count to evaluate for infection and blood clot - Prescribed doxycycline  and instructed to discontinue amoxicillin  - Instructed to monitor redness daily and mark the extent of redness to track progression - Advised to avoid cutting or manipulating the affected area  Evaluation for deep vein thrombosis of left leg Concern for deep vein thrombosis (DVT) due to swelling and redness of the left leg. Risk of DVT is heightened by the acute presentation and lack of improvement with current antibiotic therapy. - Ordered D-dimer test to evaluate for DVT - If D-dimer is elevated, will order ultrasound of the left leg to confirm DVT, pt denies ultrasound at this time and will await results of d dimer (advised pt of the  risks involved if DVT and also gave pt stat ER precautions, pt still declines)  History of nonobstructive coronary artery disease Nonobstructive coronary artery disease with previous cardiac catheterization showing nonobstructed coronary arteries. Currently on aspirin  325 mg daily, which poses a bleeding risk - Sent note to pcp to discuss the necessity of continuing aspirin  325 mg daily vs 81 mg as I am not as familiar with his history   Hypertension Managed with phentermine for weight loss. Phentermine poses a risk of increasing blood pressure and potential cardiovascular issues, especially given his coronary artery disease. - Advised to discuss with pcp the continuation of phentermine, especially given the potential cardiovascular risks (he receives this from a weight loss center)         Return in about 1 week (around 07/28/2024) for f/u cellulitis in one week with his  PCP .     Ginger Patrick, MSN, APRN, FNP-C [facility]

## 2024-07-21 NOTE — Telephone Encounter (Signed)
 FYI Only or Action Required?: FYI only for provider: appointment scheduled on 07/21/24.  Patient was last seen in primary care on 08/27/2023 by Gretta Comer POUR, NP.  Called Nurse Triage reporting Leg Injury.  Symptoms began a week ago.  Interventions attempted: Rest, hydration, or home remedies.  Symptoms are: unchanged.  Triage Disposition: See Physician Within 24 Hours  Patient/caregiver understands and will follow disposition?: Yes Reason for Disposition  [1] MODERATE pain (e.g., interferes with normal activities, limping) AND [2] high-risk adult (e.g., age > 60 years, osteoporosis, chronic steroid use)  Answer Assessment - Initial Assessment Questions Patient's wife Sari calling in on behalf of patient today. Warm to touch.   1. MECHANISM: How did the injury happen? (e.g., twisting injury, direct blow)      Hit left knee area on a trailer hitch  2. ONSET: When did the injury happen? (e.g., minutes, hours ago)      A week ago  3. LOCATION: Where is the injury located?      Left leg, knee and down into calf  4. APPEARANCE of INJURY: What does the injury look like?  (e.g., deformity of leg)     Below the knee, calf area is red and swollen  5. SEVERITY: Can you put weight on that leg? Can you walk?      Able to walk  6. SIZE: For cuts, bruises, or swelling, ask: How large is it? (e.g., inches or centimeters)      Denies  7. PAIN: Is there pain? If Yes, ask: How bad is the pain?   What does it keep you from doing? (Scale 0-10; or none, mild, moderate, severe)     7/10  8. OTHER SYMPTOMS: Do you have any other symptoms?      Denies  Protocols used: Leg Injury-A-AH  Copied from CRM #[id number]. Topic: Clinical - Red Word Triage >> Jul 21, 2024  8:05 AM Victoria A wrote: [facility] that prompted transfer to Nurse Triage: Patient's left leg was hit on the trailer hitch and it is now swollen,red and painful.

## 2024-07-22 ENCOUNTER — Ambulatory Visit: Payer: Self-pay | Admitting: Family

## 2024-07-22 LAB — D-DIMER, QUANTITATIVE: D-Dimer, Quant: 0.42 ug{FEU}/mL (ref <0.50)

## 2024-07-29 NOTE — Progress Notes (Unsigned)
     Akiel Fennell T. Derek Laughter, MD, CAQ Sports Medicine [facility] [address] [place], [address]  Phone: [phone]  FAX: [phone]  Samuel Heath - 63 y.o. male  MRN [id number]  Date of Birth: [birth year]  Date: 07/30/2024  PCP: Gretta Comer POUR, NP  Referral: Gretta Comer POUR, NP  No chief complaint on file.  Subjective:   Samuel Heath is a 63 y.o. very pleasant male patient with There is no height or weight on file to calculate BMI. who presents with the following:  Discussed the use of AI scribe software for clinical note transcription with the patient, who gave verbal consent to proceed.  Patient presents with ongoing right-sided shoulder pain.  On chart review, it looks like he actually has very recently been seen for left-sided knee pain. History of Present Illness     Review of Systems is noted in the HPI, as appropriate  Objective:   There were no vitals taken for this visit.  GEN: No acute distress; alert,appropriate. PULM: Breathing comfortably in no respiratory distress PSYCH: Normally interactive.   Laboratory and Imaging Data:  Assessment and Plan:   No diagnosis found. Assessment & Plan   Medication Management during today's office visit: No orders of the defined types were placed in this encounter.  There are no discontinued medications.  Orders placed today for conditions managed today: No orders of the defined types were placed in this encounter.   Disposition: No follow-ups on file.  [employer] One speech-to-text software was used for transcription in this dictation.  Possible transcriptional errors can occur using Animal nutritionist.   Signed,  Jacques DASEN. Josiah Nieto, MD   Outpatient Encounter Medications as of 07/30/2024  Medication Sig   aspirin  325 MG tablet Take 325 mg by mouth.    Cyanocobalamin (VITAMIN B 12 PO) Take 1 tablet by mouth daily. Reported on 12/09/2015   doxycycline   (VIBRA -TABS) 100 MG tablet Take 1 tablet (100 mg total) by mouth 2 (two) times daily for 10 days.   gabapentin  (NEURONTIN ) 600 MG tablet Take 1 tablet (600 mg total) by mouth 2 (two) times daily.   hydrochlorothiazide  (HYDRODIURIL ) 12.5 MG tablet Take 1 tablet by mouth once daily for blood pressure   losartan  (COZAAR ) 100 MG tablet Take 1 tablet by mouth once daily for blood pressure   metFORMIN  (GLUCOPHAGE ) 500 MG tablet TAKE 1 TABLET BY MOUTH TWICE DAILY WITH A MEAL FOR DIABETES   Multiple Vitamins-Minerals (PX COMPLETE SENIOR MULTIVITS) TABS Take by mouth.   mupirocin  ointment (BACTROBAN ) 2 % Apply 1 Application topically 2 (two) times daily.   omeprazole (PRILOSEC) 20 MG capsule Take 20 mg by mouth daily.   phentermine 37.5 MG capsule Take 37.5 mg by mouth every morning.   tamsulosin  (FLOMAX ) 0.4 MG CAPS capsule TAKE 1 CAPSULE BY MOUTH ONCE DAILY FOR URINE FLOW   Facility-Administered Encounter Medications as of 07/30/2024  Medication   betamethasone  acetate-betamethasone  sodium phosphate  (CELESTONE ) injection 12 mg

## 2024-07-30 ENCOUNTER — Ambulatory Visit (INDEPENDENT_AMBULATORY_CARE_PROVIDER_SITE_OTHER)
Admission: RE | Admit: 2024-07-30 | Discharge: 2024-07-30 | Disposition: A | Source: Ambulatory Visit | Attending: Family Medicine | Admitting: Family Medicine

## 2024-07-30 ENCOUNTER — Encounter: Payer: Self-pay | Admitting: Family Medicine

## 2024-07-30 ENCOUNTER — Ambulatory Visit: Payer: Self-pay | Admitting: Family Medicine

## 2024-07-30 ENCOUNTER — Ambulatory Visit: Admitting: Family Medicine

## 2024-07-30 VITALS — BP 110/62 | HR 99 | Temp 99.0°F | Ht 69.0 in | Wt 219.5 lb

## 2024-07-30 DIAGNOSIS — G8929 Other chronic pain: Secondary | ICD-10-CM | POA: Diagnosis not present

## 2024-07-30 DIAGNOSIS — M25511 Pain in right shoulder: Secondary | ICD-10-CM | POA: Diagnosis not present

## 2024-07-30 DIAGNOSIS — M19011 Primary osteoarthritis, right shoulder: Secondary | ICD-10-CM | POA: Diagnosis not present

## 2024-07-30 DIAGNOSIS — M25562 Pain in left knee: Secondary | ICD-10-CM

## 2024-07-30 MED ORDER — CELECOXIB 200 MG PO CAPS: 200.0000 mg | ORAL_CAPSULE | Freq: Every day | ORAL | 2 refills | Status: AC

## 2024-07-30 MED ORDER — PREDNISONE 20 MG PO TABS: ORAL_TABLET | ORAL | 0 refills | Status: DC

## 2024-08-13 ENCOUNTER — Ambulatory Visit: Admitting: Primary Care

## 2024-08-27 ENCOUNTER — Encounter: Payer: Self-pay | Admitting: Primary Care

## 2024-08-27 ENCOUNTER — Ambulatory Visit: Payer: Self-pay | Admitting: Primary Care

## 2024-08-27 VITALS — BP 130/80 | HR 87 | Temp 98.0°F | Ht 69.0 in | Wt 222.1 lb

## 2024-08-27 DIAGNOSIS — N529 Male erectile dysfunction, unspecified: Secondary | ICD-10-CM | POA: Diagnosis not present

## 2024-08-27 DIAGNOSIS — E119 Type 2 diabetes mellitus without complications: Secondary | ICD-10-CM

## 2024-08-27 DIAGNOSIS — I2510 Atherosclerotic heart disease of native coronary artery without angina pectoris: Secondary | ICD-10-CM

## 2024-08-27 DIAGNOSIS — Z1211 Encounter for screening for malignant neoplasm of colon: Secondary | ICD-10-CM | POA: Diagnosis not present

## 2024-08-27 DIAGNOSIS — E782 Mixed hyperlipidemia: Secondary | ICD-10-CM

## 2024-08-27 DIAGNOSIS — Z125 Encounter for screening for malignant neoplasm of prostate: Secondary | ICD-10-CM | POA: Diagnosis not present

## 2024-08-27 DIAGNOSIS — Z0000 Encounter for general adult medical examination without abnormal findings: Secondary | ICD-10-CM | POA: Diagnosis not present

## 2024-08-27 DIAGNOSIS — I10 Essential (primary) hypertension: Secondary | ICD-10-CM | POA: Diagnosis not present

## 2024-08-27 LAB — COMPREHENSIVE METABOLIC PANEL WITH GFR
ALT: 30 U/L (ref 3–53)
AST: 26 U/L (ref 5–37)
Albumin: 4.1 g/dL (ref 3.5–5.2)
Alkaline Phosphatase: 54 U/L (ref 39–117)
BUN: 15 mg/dL (ref 6–23)
CO2: 31 meq/L (ref 19–32)
Calcium: 8.9 mg/dL (ref 8.4–10.5)
Chloride: 99 meq/L (ref 96–112)
Creatinine, Ser: 0.74 mg/dL (ref 0.40–1.50)
GFR: 95.84 mL/min
Glucose, Bld: 100 mg/dL — ABNORMAL HIGH (ref 70–99)
Potassium: 4.3 meq/L (ref 3.5–5.1)
Sodium: 136 meq/L (ref 135–145)
Total Bilirubin: 0.5 mg/dL (ref 0.2–1.2)
Total Protein: 6.6 g/dL (ref 6.0–8.3)

## 2024-08-27 LAB — LIPID PANEL
Cholesterol: 140 mg/dL (ref 28–200)
HDL: 59.7 mg/dL
LDL Cholesterol: 65 mg/dL (ref 10–99)
NonHDL: 80.16
Total CHOL/HDL Ratio: 2
Triglycerides: 77 mg/dL (ref 10.0–149.0)
VLDL: 15.4 mg/dL (ref 0.0–40.0)

## 2024-08-27 LAB — MICROALBUMIN / CREATININE URINE RATIO
Creatinine,U: 52.2 mg/dL
Microalb Creat Ratio: UNDETERMINED mg/g (ref 0.0–30.0)
Microalb, Ur: <0.7 mg/dL

## 2024-08-27 LAB — HEMOGLOBIN A1C: Hgb A1c MFr Bld: 5.8 % (ref 4.6–6.5)

## 2024-08-27 LAB — PSA: PSA: 0.45 ng/mL (ref 0.10–4.00)

## 2024-08-27 MED ORDER — TADALAFIL 5 MG PO TABS: 5.0000 mg | ORAL_TABLET | Freq: Every day | ORAL | 3 refills | Status: AC

## 2024-08-27 NOTE — Assessment & Plan Note (Signed)
 Asymptomatic.  Continue diabetes, lipid, and BP control.

## 2024-08-27 NOTE — Assessment & Plan Note (Signed)
 Controlled with treatment.  Resume Cialis  5 mg daily for ED and urine flow. Refills sent to pharmacy

## 2024-08-27 NOTE — Assessment & Plan Note (Signed)
 Repeat lipid panel pending.  Work on a healthy diet and regular exercise in order for weight loss, and to reduce the risk of further co-morbidity.

## 2024-08-27 NOTE — Assessment & Plan Note (Signed)
 Declines influenza and Shingrix vaccines Colonoscopy overdue and he declines. He agrees to [employer].  PSA due and pending.  Discussed the importance of a healthy diet and regular exercise in order for weight loss, and to reduce the risk of further co-morbidity.  Exam stable. Labs pending.  Follow up in 1 year for repeat physical.

## 2024-08-27 NOTE — Assessment & Plan Note (Signed)
 Repeat A1C pending.  Continue off treatment.

## 2024-08-27 NOTE — Patient Instructions (Addendum)
 Stop by the lab prior to leaving today. I will notify you of your results once received.   It was a pleasure to see you today!

## 2024-08-27 NOTE — Assessment & Plan Note (Signed)
Controlled.  Continue losartan 100 mg daily, hydrochlorothiazide 12.5 mg daily. CMP pending.
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
## 2024-08-31 ENCOUNTER — Other Ambulatory Visit: Payer: Self-pay | Admitting: Primary Care

## 2024-08-31 DIAGNOSIS — I10 Essential (primary) hypertension: Secondary | ICD-10-CM

## 2024-09-14 ENCOUNTER — Other Ambulatory Visit: Payer: Self-pay | Admitting: Primary Care

## 2024-09-14 DIAGNOSIS — E785 Hyperlipidemia, unspecified: Secondary | ICD-10-CM

## 2025-02-24 ENCOUNTER — Ambulatory Visit: Admitting: Primary Care
